# Patient Record
Sex: Female | Born: 1935
Health system: Southern US, Community
[De-identification: ages and names within clinical notes are randomized; demographics above are authoritative.]

## PROBLEM LIST (undated history)

## (undated) DIAGNOSIS — E7800 Pure hypercholesterolemia, unspecified: Secondary | ICD-10-CM

## (undated) DIAGNOSIS — I10 Essential (primary) hypertension: Secondary | ICD-10-CM

## (undated) DIAGNOSIS — M810 Age-related osteoporosis without current pathological fracture: Secondary | ICD-10-CM

## (undated) DIAGNOSIS — E079 Disorder of thyroid, unspecified: Secondary | ICD-10-CM

## (undated) HISTORY — PX: ABDOMINAL HYSTERECTOMY: SHX81

## (undated) HISTORY — DX: Age-related osteoporosis without current pathological fracture: M81.0

## (undated) HISTORY — PX: TONSILLECTOMY: SUR1361

---

## 2001-02-17 ENCOUNTER — Inpatient Hospital Stay (HOSPITAL_COMMUNITY): Admission: AD | Admit: 2001-02-17 | Discharge: 2001-02-18 | Payer: Self-pay | Admitting: Internal Medicine

## 2001-02-18 ENCOUNTER — Encounter: Payer: Self-pay | Admitting: Cardiology

## 2002-08-14 HISTORY — PX: ANKLE SURGERY: SHX546

## 2009-11-16 ENCOUNTER — Ambulatory Visit: Payer: Self-pay | Admitting: Cardiology

## 2011-08-28 DIAGNOSIS — H35379 Puckering of macula, unspecified eye: Secondary | ICD-10-CM | POA: Diagnosis not present

## 2011-08-28 DIAGNOSIS — H25019 Cortical age-related cataract, unspecified eye: Secondary | ICD-10-CM | POA: Diagnosis not present

## 2011-08-28 DIAGNOSIS — H40039 Anatomical narrow angle, unspecified eye: Secondary | ICD-10-CM | POA: Diagnosis not present

## 2011-09-10 DIAGNOSIS — Z79899 Other long term (current) drug therapy: Secondary | ICD-10-CM | POA: Diagnosis not present

## 2011-09-10 DIAGNOSIS — R0789 Other chest pain: Secondary | ICD-10-CM | POA: Diagnosis not present

## 2011-09-10 DIAGNOSIS — K449 Diaphragmatic hernia without obstruction or gangrene: Secondary | ICD-10-CM | POA: Diagnosis not present

## 2011-09-10 DIAGNOSIS — E039 Hypothyroidism, unspecified: Secondary | ICD-10-CM | POA: Diagnosis not present

## 2011-09-10 DIAGNOSIS — R079 Chest pain, unspecified: Secondary | ICD-10-CM | POA: Diagnosis not present

## 2011-09-10 DIAGNOSIS — Z780 Asymptomatic menopausal state: Secondary | ICD-10-CM | POA: Diagnosis not present

## 2011-09-10 DIAGNOSIS — Z8249 Family history of ischemic heart disease and other diseases of the circulatory system: Secondary | ICD-10-CM | POA: Diagnosis not present

## 2011-09-10 DIAGNOSIS — E785 Hyperlipidemia, unspecified: Secondary | ICD-10-CM | POA: Diagnosis not present

## 2011-09-11 DIAGNOSIS — E785 Hyperlipidemia, unspecified: Secondary | ICD-10-CM | POA: Diagnosis not present

## 2011-09-11 DIAGNOSIS — E039 Hypothyroidism, unspecified: Secondary | ICD-10-CM | POA: Diagnosis not present

## 2011-09-11 DIAGNOSIS — R079 Chest pain, unspecified: Secondary | ICD-10-CM | POA: Diagnosis not present

## 2011-09-11 DIAGNOSIS — K449 Diaphragmatic hernia without obstruction or gangrene: Secondary | ICD-10-CM | POA: Diagnosis not present

## 2011-09-13 DIAGNOSIS — R079 Chest pain, unspecified: Secondary | ICD-10-CM | POA: Diagnosis not present

## 2011-09-26 DIAGNOSIS — E782 Mixed hyperlipidemia: Secondary | ICD-10-CM | POA: Diagnosis not present

## 2011-09-26 DIAGNOSIS — E038 Other specified hypothyroidism: Secondary | ICD-10-CM | POA: Diagnosis not present

## 2011-09-26 DIAGNOSIS — E039 Hypothyroidism, unspecified: Secondary | ICD-10-CM | POA: Diagnosis not present

## 2011-09-26 DIAGNOSIS — I209 Angina pectoris, unspecified: Secondary | ICD-10-CM | POA: Diagnosis not present

## 2011-09-26 DIAGNOSIS — K2900 Acute gastritis without bleeding: Secondary | ICD-10-CM | POA: Diagnosis not present

## 2011-10-02 DIAGNOSIS — K8020 Calculus of gallbladder without cholecystitis without obstruction: Secondary | ICD-10-CM | POA: Diagnosis not present

## 2011-10-02 DIAGNOSIS — R109 Unspecified abdominal pain: Secondary | ICD-10-CM | POA: Diagnosis not present

## 2011-10-11 DIAGNOSIS — H2589 Other age-related cataract: Secondary | ICD-10-CM | POA: Diagnosis not present

## 2011-10-19 DIAGNOSIS — H2589 Other age-related cataract: Secondary | ICD-10-CM | POA: Diagnosis not present

## 2011-10-19 DIAGNOSIS — H40009 Preglaucoma, unspecified, unspecified eye: Secondary | ICD-10-CM | POA: Diagnosis not present

## 2011-10-19 DIAGNOSIS — H269 Unspecified cataract: Secondary | ICD-10-CM | POA: Diagnosis not present

## 2011-12-26 DIAGNOSIS — E039 Hypothyroidism, unspecified: Secondary | ICD-10-CM | POA: Diagnosis not present

## 2012-03-26 DIAGNOSIS — E038 Other specified hypothyroidism: Secondary | ICD-10-CM | POA: Diagnosis not present

## 2012-03-26 DIAGNOSIS — E782 Mixed hyperlipidemia: Secondary | ICD-10-CM | POA: Diagnosis not present

## 2012-03-26 DIAGNOSIS — E039 Hypothyroidism, unspecified: Secondary | ICD-10-CM | POA: Diagnosis not present

## 2012-03-27 DIAGNOSIS — Z961 Presence of intraocular lens: Secondary | ICD-10-CM | POA: Diagnosis not present

## 2012-03-27 DIAGNOSIS — H35379 Puckering of macula, unspecified eye: Secondary | ICD-10-CM | POA: Diagnosis not present

## 2012-03-27 DIAGNOSIS — H2589 Other age-related cataract: Secondary | ICD-10-CM | POA: Diagnosis not present

## 2012-04-22 DIAGNOSIS — Z23 Encounter for immunization: Secondary | ICD-10-CM | POA: Diagnosis not present

## 2012-07-01 DIAGNOSIS — H02409 Unspecified ptosis of unspecified eyelid: Secondary | ICD-10-CM | POA: Diagnosis not present

## 2012-07-02 DIAGNOSIS — E039 Hypothyroidism, unspecified: Secondary | ICD-10-CM | POA: Diagnosis not present

## 2012-07-15 DIAGNOSIS — M7130 Other bursal cyst, unspecified site: Secondary | ICD-10-CM | POA: Diagnosis not present

## 2012-10-01 DIAGNOSIS — M7120 Synovial cyst of popliteal space [Baker], unspecified knee: Secondary | ICD-10-CM | POA: Diagnosis not present

## 2012-10-01 DIAGNOSIS — E782 Mixed hyperlipidemia: Secondary | ICD-10-CM | POA: Diagnosis not present

## 2012-10-17 DIAGNOSIS — H02409 Unspecified ptosis of unspecified eyelid: Secondary | ICD-10-CM | POA: Diagnosis not present

## 2012-10-21 DIAGNOSIS — S82409A Unspecified fracture of shaft of unspecified fibula, initial encounter for closed fracture: Secondary | ICD-10-CM | POA: Diagnosis not present

## 2012-10-21 DIAGNOSIS — M25569 Pain in unspecified knee: Secondary | ICD-10-CM | POA: Diagnosis not present

## 2012-12-06 DIAGNOSIS — H1130 Conjunctival hemorrhage, unspecified eye: Secondary | ICD-10-CM | POA: Diagnosis not present

## 2012-12-24 DIAGNOSIS — A938 Other specified arthropod-borne viral fevers: Secondary | ICD-10-CM | POA: Diagnosis not present

## 2012-12-24 DIAGNOSIS — E038 Other specified hypothyroidism: Secondary | ICD-10-CM | POA: Diagnosis not present

## 2013-01-07 DIAGNOSIS — M7120 Synovial cyst of popliteal space [Baker], unspecified knee: Secondary | ICD-10-CM | POA: Diagnosis not present

## 2013-01-10 DIAGNOSIS — M7120 Synovial cyst of popliteal space [Baker], unspecified knee: Secondary | ICD-10-CM | POA: Diagnosis not present

## 2013-03-17 DIAGNOSIS — H029 Unspecified disorder of eyelid: Secondary | ICD-10-CM | POA: Diagnosis not present

## 2013-03-17 DIAGNOSIS — H02429 Myogenic ptosis of unspecified eyelid: Secondary | ICD-10-CM | POA: Diagnosis not present

## 2013-03-31 DIAGNOSIS — E038 Other specified hypothyroidism: Secondary | ICD-10-CM | POA: Diagnosis not present

## 2013-03-31 DIAGNOSIS — E782 Mixed hyperlipidemia: Secondary | ICD-10-CM | POA: Diagnosis not present

## 2013-05-14 DIAGNOSIS — Z961 Presence of intraocular lens: Secondary | ICD-10-CM | POA: Diagnosis not present

## 2013-05-14 DIAGNOSIS — H2589 Other age-related cataract: Secondary | ICD-10-CM | POA: Diagnosis not present

## 2013-05-14 DIAGNOSIS — H532 Diplopia: Secondary | ICD-10-CM | POA: Diagnosis not present

## 2013-06-12 DIAGNOSIS — Z23 Encounter for immunization: Secondary | ICD-10-CM | POA: Diagnosis not present

## 2013-07-01 DIAGNOSIS — R079 Chest pain, unspecified: Secondary | ICD-10-CM | POA: Diagnosis not present

## 2013-07-01 DIAGNOSIS — R059 Cough, unspecified: Secondary | ICD-10-CM | POA: Diagnosis not present

## 2013-07-01 DIAGNOSIS — R05 Cough: Secondary | ICD-10-CM | POA: Diagnosis not present

## 2013-09-23 DIAGNOSIS — Z0000 Encounter for general adult medical examination without abnormal findings: Secondary | ICD-10-CM | POA: Diagnosis not present

## 2013-09-23 DIAGNOSIS — E039 Hypothyroidism, unspecified: Secondary | ICD-10-CM | POA: Diagnosis not present

## 2013-09-23 DIAGNOSIS — E782 Mixed hyperlipidemia: Secondary | ICD-10-CM | POA: Diagnosis not present

## 2013-11-13 DIAGNOSIS — Z86010 Personal history of colonic polyps: Secondary | ICD-10-CM | POA: Diagnosis not present

## 2013-11-18 DIAGNOSIS — Z1211 Encounter for screening for malignant neoplasm of colon: Secondary | ICD-10-CM | POA: Diagnosis not present

## 2013-11-18 DIAGNOSIS — D371 Neoplasm of uncertain behavior of stomach: Secondary | ICD-10-CM | POA: Diagnosis not present

## 2013-11-18 DIAGNOSIS — D1779 Benign lipomatous neoplasm of other sites: Secondary | ICD-10-CM | POA: Diagnosis not present

## 2013-11-18 DIAGNOSIS — E039 Hypothyroidism, unspecified: Secondary | ICD-10-CM | POA: Diagnosis not present

## 2013-11-18 DIAGNOSIS — E785 Hyperlipidemia, unspecified: Secondary | ICD-10-CM | POA: Diagnosis not present

## 2013-11-18 DIAGNOSIS — Z860100 Personal history of colon polyps, unspecified: Secondary | ICD-10-CM | POA: Diagnosis not present

## 2013-11-18 DIAGNOSIS — Z7982 Long term (current) use of aspirin: Secondary | ICD-10-CM | POA: Diagnosis not present

## 2013-11-18 DIAGNOSIS — K644 Residual hemorrhoidal skin tags: Secondary | ICD-10-CM | POA: Diagnosis not present

## 2013-11-18 DIAGNOSIS — Z79899 Other long term (current) drug therapy: Secondary | ICD-10-CM | POA: Diagnosis not present

## 2013-11-18 DIAGNOSIS — D175 Benign lipomatous neoplasm of intra-abdominal organs: Secondary | ICD-10-CM | POA: Diagnosis not present

## 2013-11-18 DIAGNOSIS — D378 Neoplasm of uncertain behavior of other specified digestive organs: Secondary | ICD-10-CM | POA: Diagnosis not present

## 2013-11-18 DIAGNOSIS — Z823 Family history of stroke: Secondary | ICD-10-CM | POA: Diagnosis not present

## 2013-11-18 DIAGNOSIS — Z8249 Family history of ischemic heart disease and other diseases of the circulatory system: Secondary | ICD-10-CM | POA: Diagnosis not present

## 2013-12-23 DIAGNOSIS — E039 Hypothyroidism, unspecified: Secondary | ICD-10-CM | POA: Diagnosis not present

## 2013-12-29 DIAGNOSIS — Z79899 Other long term (current) drug therapy: Secondary | ICD-10-CM | POA: Diagnosis not present

## 2013-12-29 DIAGNOSIS — M899 Disorder of bone, unspecified: Secondary | ICD-10-CM | POA: Diagnosis not present

## 2013-12-29 DIAGNOSIS — Z780 Asymptomatic menopausal state: Secondary | ICD-10-CM | POA: Diagnosis not present

## 2013-12-29 DIAGNOSIS — M810 Age-related osteoporosis without current pathological fracture: Secondary | ICD-10-CM | POA: Diagnosis not present

## 2013-12-29 DIAGNOSIS — M949 Disorder of cartilage, unspecified: Secondary | ICD-10-CM | POA: Diagnosis not present

## 2014-01-12 DIAGNOSIS — R928 Other abnormal and inconclusive findings on diagnostic imaging of breast: Secondary | ICD-10-CM | POA: Diagnosis not present

## 2014-01-12 DIAGNOSIS — A938 Other specified arthropod-borne viral fevers: Secondary | ICD-10-CM | POA: Diagnosis not present

## 2014-01-12 DIAGNOSIS — J209 Acute bronchitis, unspecified: Secondary | ICD-10-CM | POA: Diagnosis not present

## 2014-01-12 DIAGNOSIS — Z1231 Encounter for screening mammogram for malignant neoplasm of breast: Secondary | ICD-10-CM | POA: Diagnosis not present

## 2014-01-14 DIAGNOSIS — M810 Age-related osteoporosis without current pathological fracture: Secondary | ICD-10-CM | POA: Diagnosis not present

## 2014-01-20 DIAGNOSIS — N63 Unspecified lump in unspecified breast: Secondary | ICD-10-CM | POA: Diagnosis not present

## 2014-01-20 DIAGNOSIS — N6009 Solitary cyst of unspecified breast: Secondary | ICD-10-CM | POA: Diagnosis not present

## 2014-01-20 DIAGNOSIS — R928 Other abnormal and inconclusive findings on diagnostic imaging of breast: Secondary | ICD-10-CM | POA: Diagnosis not present

## 2014-03-31 DIAGNOSIS — E039 Hypothyroidism, unspecified: Secondary | ICD-10-CM | POA: Diagnosis not present

## 2014-03-31 DIAGNOSIS — E038 Other specified hypothyroidism: Secondary | ICD-10-CM | POA: Diagnosis not present

## 2014-03-31 DIAGNOSIS — E782 Mixed hyperlipidemia: Secondary | ICD-10-CM | POA: Diagnosis not present

## 2014-05-27 DIAGNOSIS — Z23 Encounter for immunization: Secondary | ICD-10-CM | POA: Diagnosis not present

## 2014-06-15 DIAGNOSIS — H40031 Anatomical narrow angle, right eye: Secondary | ICD-10-CM | POA: Diagnosis not present

## 2014-06-15 DIAGNOSIS — H2511 Age-related nuclear cataract, right eye: Secondary | ICD-10-CM | POA: Diagnosis not present

## 2014-06-23 DIAGNOSIS — H2511 Age-related nuclear cataract, right eye: Secondary | ICD-10-CM | POA: Diagnosis not present

## 2014-06-30 DIAGNOSIS — Z9842 Cataract extraction status, left eye: Secondary | ICD-10-CM | POA: Diagnosis not present

## 2014-06-30 DIAGNOSIS — Z961 Presence of intraocular lens: Secondary | ICD-10-CM | POA: Diagnosis not present

## 2014-06-30 DIAGNOSIS — H2511 Age-related nuclear cataract, right eye: Secondary | ICD-10-CM | POA: Diagnosis not present

## 2014-06-30 DIAGNOSIS — E039 Hypothyroidism, unspecified: Secondary | ICD-10-CM | POA: Diagnosis not present

## 2014-06-30 DIAGNOSIS — H40003 Preglaucoma, unspecified, bilateral: Secondary | ICD-10-CM | POA: Diagnosis not present

## 2014-07-21 DIAGNOSIS — E039 Hypothyroidism, unspecified: Secondary | ICD-10-CM | POA: Diagnosis not present

## 2014-07-27 DIAGNOSIS — M810 Age-related osteoporosis without current pathological fracture: Secondary | ICD-10-CM | POA: Diagnosis not present

## 2014-08-04 DIAGNOSIS — D485 Neoplasm of uncertain behavior of skin: Secondary | ICD-10-CM | POA: Diagnosis not present

## 2014-08-04 DIAGNOSIS — H02822 Cysts of right lower eyelid: Secondary | ICD-10-CM | POA: Diagnosis not present

## 2014-11-17 DIAGNOSIS — E039 Hypothyroidism, unspecified: Secondary | ICD-10-CM | POA: Diagnosis not present

## 2014-11-17 DIAGNOSIS — L570 Actinic keratosis: Secondary | ICD-10-CM | POA: Diagnosis not present

## 2014-12-15 DIAGNOSIS — H26491 Other secondary cataract, right eye: Secondary | ICD-10-CM | POA: Diagnosis not present

## 2015-01-25 DIAGNOSIS — Z1231 Encounter for screening mammogram for malignant neoplasm of breast: Secondary | ICD-10-CM | POA: Diagnosis not present

## 2015-01-28 DIAGNOSIS — M810 Age-related osteoporosis without current pathological fracture: Secondary | ICD-10-CM | POA: Diagnosis not present

## 2015-03-16 DIAGNOSIS — L570 Actinic keratosis: Secondary | ICD-10-CM | POA: Diagnosis not present

## 2015-03-16 DIAGNOSIS — Z23 Encounter for immunization: Secondary | ICD-10-CM | POA: Diagnosis not present

## 2015-03-16 DIAGNOSIS — Z1389 Encounter for screening for other disorder: Secondary | ICD-10-CM | POA: Diagnosis not present

## 2015-03-16 DIAGNOSIS — E039 Hypothyroidism, unspecified: Secondary | ICD-10-CM | POA: Diagnosis not present

## 2015-03-16 DIAGNOSIS — Z0000 Encounter for general adult medical examination without abnormal findings: Secondary | ICD-10-CM | POA: Diagnosis not present

## 2015-04-22 DIAGNOSIS — M170 Bilateral primary osteoarthritis of knee: Secondary | ICD-10-CM | POA: Diagnosis not present

## 2015-04-22 DIAGNOSIS — M1711 Unilateral primary osteoarthritis, right knee: Secondary | ICD-10-CM | POA: Diagnosis not present

## 2015-04-22 DIAGNOSIS — M1712 Unilateral primary osteoarthritis, left knee: Secondary | ICD-10-CM | POA: Diagnosis not present

## 2015-05-11 DIAGNOSIS — M170 Bilateral primary osteoarthritis of knee: Secondary | ICD-10-CM | POA: Diagnosis not present

## 2015-05-11 DIAGNOSIS — M6281 Muscle weakness (generalized): Secondary | ICD-10-CM | POA: Diagnosis not present

## 2015-05-11 DIAGNOSIS — R2689 Other abnormalities of gait and mobility: Secondary | ICD-10-CM | POA: Diagnosis not present

## 2015-05-12 DIAGNOSIS — M6281 Muscle weakness (generalized): Secondary | ICD-10-CM | POA: Diagnosis not present

## 2015-05-12 DIAGNOSIS — M170 Bilateral primary osteoarthritis of knee: Secondary | ICD-10-CM | POA: Diagnosis not present

## 2015-05-12 DIAGNOSIS — R2689 Other abnormalities of gait and mobility: Secondary | ICD-10-CM | POA: Diagnosis not present

## 2015-05-17 DIAGNOSIS — R2689 Other abnormalities of gait and mobility: Secondary | ICD-10-CM | POA: Diagnosis not present

## 2015-05-17 DIAGNOSIS — M170 Bilateral primary osteoarthritis of knee: Secondary | ICD-10-CM | POA: Diagnosis not present

## 2015-05-17 DIAGNOSIS — M6281 Muscle weakness (generalized): Secondary | ICD-10-CM | POA: Diagnosis not present

## 2015-05-20 DIAGNOSIS — M170 Bilateral primary osteoarthritis of knee: Secondary | ICD-10-CM | POA: Diagnosis not present

## 2015-05-20 DIAGNOSIS — M6281 Muscle weakness (generalized): Secondary | ICD-10-CM | POA: Diagnosis not present

## 2015-05-20 DIAGNOSIS — R2689 Other abnormalities of gait and mobility: Secondary | ICD-10-CM | POA: Diagnosis not present

## 2015-05-25 DIAGNOSIS — R2689 Other abnormalities of gait and mobility: Secondary | ICD-10-CM | POA: Diagnosis not present

## 2015-05-25 DIAGNOSIS — M6281 Muscle weakness (generalized): Secondary | ICD-10-CM | POA: Diagnosis not present

## 2015-05-25 DIAGNOSIS — M170 Bilateral primary osteoarthritis of knee: Secondary | ICD-10-CM | POA: Diagnosis not present

## 2015-05-26 DIAGNOSIS — R2689 Other abnormalities of gait and mobility: Secondary | ICD-10-CM | POA: Diagnosis not present

## 2015-05-26 DIAGNOSIS — M170 Bilateral primary osteoarthritis of knee: Secondary | ICD-10-CM | POA: Diagnosis not present

## 2015-05-26 DIAGNOSIS — M6281 Muscle weakness (generalized): Secondary | ICD-10-CM | POA: Diagnosis not present

## 2015-06-01 DIAGNOSIS — M170 Bilateral primary osteoarthritis of knee: Secondary | ICD-10-CM | POA: Diagnosis not present

## 2015-06-01 DIAGNOSIS — M6281 Muscle weakness (generalized): Secondary | ICD-10-CM | POA: Diagnosis not present

## 2015-06-01 DIAGNOSIS — R2689 Other abnormalities of gait and mobility: Secondary | ICD-10-CM | POA: Diagnosis not present

## 2015-06-02 DIAGNOSIS — R2689 Other abnormalities of gait and mobility: Secondary | ICD-10-CM | POA: Diagnosis not present

## 2015-06-02 DIAGNOSIS — M170 Bilateral primary osteoarthritis of knee: Secondary | ICD-10-CM | POA: Diagnosis not present

## 2015-06-02 DIAGNOSIS — M6281 Muscle weakness (generalized): Secondary | ICD-10-CM | POA: Diagnosis not present

## 2015-06-24 DIAGNOSIS — Z23 Encounter for immunization: Secondary | ICD-10-CM | POA: Diagnosis not present

## 2015-07-20 DIAGNOSIS — B028 Zoster with other complications: Secondary | ICD-10-CM | POA: Diagnosis not present

## 2015-07-20 DIAGNOSIS — E038 Other specified hypothyroidism: Secondary | ICD-10-CM | POA: Diagnosis not present

## 2015-07-27 DIAGNOSIS — M810 Age-related osteoporosis without current pathological fracture: Secondary | ICD-10-CM | POA: Diagnosis not present

## 2015-11-16 DIAGNOSIS — M818 Other osteoporosis without current pathological fracture: Secondary | ICD-10-CM | POA: Diagnosis not present

## 2015-11-16 DIAGNOSIS — E038 Other specified hypothyroidism: Secondary | ICD-10-CM | POA: Diagnosis not present

## 2015-11-16 DIAGNOSIS — E784 Other hyperlipidemia: Secondary | ICD-10-CM | POA: Diagnosis not present

## 2016-01-27 DIAGNOSIS — Z1231 Encounter for screening mammogram for malignant neoplasm of breast: Secondary | ICD-10-CM | POA: Diagnosis not present

## 2016-03-14 DIAGNOSIS — Z1389 Encounter for screening for other disorder: Secondary | ICD-10-CM | POA: Diagnosis not present

## 2016-03-14 DIAGNOSIS — Z79899 Other long term (current) drug therapy: Secondary | ICD-10-CM | POA: Diagnosis not present

## 2016-03-14 DIAGNOSIS — Z0000 Encounter for general adult medical examination without abnormal findings: Secondary | ICD-10-CM | POA: Diagnosis not present

## 2016-03-14 DIAGNOSIS — E038 Other specified hypothyroidism: Secondary | ICD-10-CM | POA: Diagnosis not present

## 2016-03-14 DIAGNOSIS — E784 Other hyperlipidemia: Secondary | ICD-10-CM | POA: Diagnosis not present

## 2016-05-31 DIAGNOSIS — Z23 Encounter for immunization: Secondary | ICD-10-CM | POA: Diagnosis not present

## 2016-06-07 DIAGNOSIS — E038 Other specified hypothyroidism: Secondary | ICD-10-CM | POA: Diagnosis not present

## 2016-06-07 DIAGNOSIS — E784 Other hyperlipidemia: Secondary | ICD-10-CM | POA: Diagnosis not present

## 2016-07-18 DIAGNOSIS — E038 Other specified hypothyroidism: Secondary | ICD-10-CM | POA: Diagnosis not present

## 2016-07-18 DIAGNOSIS — E784 Other hyperlipidemia: Secondary | ICD-10-CM | POA: Diagnosis not present

## 2016-07-18 DIAGNOSIS — L570 Actinic keratosis: Secondary | ICD-10-CM | POA: Diagnosis not present

## 2016-08-03 DIAGNOSIS — E784 Other hyperlipidemia: Secondary | ICD-10-CM | POA: Diagnosis not present

## 2016-08-03 DIAGNOSIS — E038 Other specified hypothyroidism: Secondary | ICD-10-CM | POA: Diagnosis not present

## 2016-08-31 DIAGNOSIS — E784 Other hyperlipidemia: Secondary | ICD-10-CM | POA: Diagnosis not present

## 2016-08-31 DIAGNOSIS — E038 Other specified hypothyroidism: Secondary | ICD-10-CM | POA: Diagnosis not present

## 2016-09-27 DIAGNOSIS — E038 Other specified hypothyroidism: Secondary | ICD-10-CM | POA: Diagnosis not present

## 2016-09-27 DIAGNOSIS — E784 Other hyperlipidemia: Secondary | ICD-10-CM | POA: Diagnosis not present

## 2016-11-14 DIAGNOSIS — E784 Other hyperlipidemia: Secondary | ICD-10-CM | POA: Diagnosis not present

## 2016-11-14 DIAGNOSIS — E038 Other specified hypothyroidism: Secondary | ICD-10-CM | POA: Diagnosis not present

## 2016-11-14 DIAGNOSIS — M80021D Age-related osteoporosis with current pathological fracture, right humerus, subsequent encounter for fracture with routine healing: Secondary | ICD-10-CM | POA: Diagnosis not present

## 2017-01-29 DIAGNOSIS — Z1231 Encounter for screening mammogram for malignant neoplasm of breast: Secondary | ICD-10-CM | POA: Diagnosis not present

## 2017-03-20 DIAGNOSIS — L570 Actinic keratosis: Secondary | ICD-10-CM | POA: Diagnosis not present

## 2017-03-20 DIAGNOSIS — E784 Other hyperlipidemia: Secondary | ICD-10-CM | POA: Diagnosis not present

## 2017-03-20 DIAGNOSIS — M80021D Age-related osteoporosis with current pathological fracture, right humerus, subsequent encounter for fracture with routine healing: Secondary | ICD-10-CM | POA: Diagnosis not present

## 2017-03-20 DIAGNOSIS — E038 Other specified hypothyroidism: Secondary | ICD-10-CM | POA: Diagnosis not present

## 2017-04-09 DIAGNOSIS — M810 Age-related osteoporosis without current pathological fracture: Secondary | ICD-10-CM | POA: Diagnosis not present

## 2017-06-20 DIAGNOSIS — Z23 Encounter for immunization: Secondary | ICD-10-CM | POA: Diagnosis not present

## 2017-06-21 DIAGNOSIS — L299 Pruritus, unspecified: Secondary | ICD-10-CM | POA: Diagnosis not present

## 2017-07-17 DIAGNOSIS — E038 Other specified hypothyroidism: Secondary | ICD-10-CM | POA: Diagnosis not present

## 2017-07-17 DIAGNOSIS — M80021D Age-related osteoporosis with current pathological fracture, right humerus, subsequent encounter for fracture with routine healing: Secondary | ICD-10-CM | POA: Diagnosis not present

## 2017-07-17 DIAGNOSIS — E7849 Other hyperlipidemia: Secondary | ICD-10-CM | POA: Diagnosis not present

## 2017-07-30 DIAGNOSIS — M810 Age-related osteoporosis without current pathological fracture: Secondary | ICD-10-CM | POA: Diagnosis not present

## 2017-07-30 DIAGNOSIS — Z780 Asymptomatic menopausal state: Secondary | ICD-10-CM | POA: Diagnosis not present

## 2017-09-23 ENCOUNTER — Other Ambulatory Visit: Payer: Self-pay

## 2017-09-23 ENCOUNTER — Encounter (HOSPITAL_COMMUNITY): Payer: Self-pay | Admitting: Emergency Medicine

## 2017-09-23 ENCOUNTER — Emergency Department (HOSPITAL_COMMUNITY): Payer: Medicare Other

## 2017-09-23 ENCOUNTER — Emergency Department (HOSPITAL_COMMUNITY)
Admission: EM | Admit: 2017-09-23 | Discharge: 2017-09-23 | Disposition: A | Discharge status: Home / Self Care | Payer: Medicare Other | Attending: Emergency Medicine | Admitting: Emergency Medicine

## 2017-09-23 DIAGNOSIS — J40 Bronchitis, not specified as acute or chronic: Secondary | ICD-10-CM | POA: Diagnosis not present

## 2017-09-23 DIAGNOSIS — R079 Chest pain, unspecified: Secondary | ICD-10-CM | POA: Diagnosis present

## 2017-09-23 DIAGNOSIS — R072 Precordial pain: Secondary | ICD-10-CM | POA: Diagnosis not present

## 2017-09-23 HISTORY — DX: Pure hypercholesterolemia, unspecified: E78.00

## 2017-09-23 HISTORY — DX: Disorder of thyroid, unspecified: E07.9

## 2017-09-23 LAB — CBC
HCT: 41.9 % (ref 36.0–46.0)
HEMOGLOBIN: 13.5 g/dL (ref 12.0–15.0)
MCH: 29.4 pg (ref 26.0–34.0)
MCHC: 32.2 g/dL (ref 30.0–36.0)
MCV: 91.3 fL (ref 78.0–100.0)
Platelets: 240 10*3/uL (ref 150–400)
RBC: 4.59 MIL/uL (ref 3.87–5.11)
RDW: 13.3 % (ref 11.5–15.5)
WBC: 4.7 10*3/uL (ref 4.0–10.5)

## 2017-09-23 LAB — BASIC METABOLIC PANEL
ANION GAP: 9 (ref 5–15)
BUN: 6 mg/dL (ref 6–20)
CALCIUM: 8.8 mg/dL — AB (ref 8.9–10.3)
CO2: 22 mmol/L (ref 22–32)
Chloride: 104 mmol/L (ref 101–111)
Creatinine, Ser: 0.52 mg/dL (ref 0.44–1.00)
Glucose, Bld: 95 mg/dL (ref 65–99)
POTASSIUM: 3.7 mmol/L (ref 3.5–5.1)
Sodium: 135 mmol/L (ref 135–145)

## 2017-09-23 LAB — TROPONIN I

## 2017-09-23 NOTE — ED Triage Notes (Signed)
Patient c/o intermittent, left-side chest pain x2 days. Denies any shortness of breath, weakness, nausea, or vomiting. Denis any cardiac hx.

## 2017-09-23 NOTE — Discharge Instructions (Signed)
Return for any new or worse chest pain.  Return for development of fevers.  Make an appointment to follow-up with your doctor if not improving over the next several days.  Today's workup without any significant cardiac findings.

## 2017-09-23 NOTE — ED Notes (Signed)
ED Provider at bedside.- Dr Zackowski 

## 2017-09-23 NOTE — ED Provider Notes (Signed)
[facility] EMERGENCY DEPARTMENT Provider Note   CSN: [id number] Arrival date & time: 09/23/17  1327     History   Chief Complaint Chief Complaint  Patient presents with  . Chest Pain    HPI Olivia Haas is a 84 y.o. female.  Patient with onset of left anterior chest pain this morning at 8 in the morning.  Not really described as pain but or as a discomfort or ache.  Dull in nature.  Nonradiating.  Will wax and wane some but has not resolved since its onset.  Patient's had an upper respiratory infection this week with a cough that is occasionally productive.  Patient without any known cardiac history.  No shortness of breath, no fevers.  Patient states that the upper respiratory infection is mild.      Past Medical History:  Diagnosis Date  . High cholesterol   . Thyroid disease     There are no active problems to display for this patient.   Past Surgical History:  Procedure Laterality Date  . ABDOMINAL HYSTERECTOMY    . TONSILLECTOMY      OB History    No data available       Home Medications    Prior to Admission medications   Not on File    Family History No family history on file.  Social History Social History   Tobacco Use  . Smoking status: Never Smoker  . Smokeless tobacco: Never Used  Substance Use Topics  . Alcohol use: No    Frequency: Never  . Drug use: No     Allergies   Patient has no known allergies.   Review of Systems Review of Systems  Constitutional: Negative for fever.  HENT: Negative for congestion.   Eyes: Negative for redness.  Respiratory: Positive for cough. Negative for shortness of breath.   Cardiovascular: Positive for chest pain.  Gastrointestinal: Negative for abdominal pain.  Genitourinary: Negative for dysuria.  Musculoskeletal: Negative for back pain and myalgias.  Skin: Negative for rash.  Neurological: Negative for syncope and headaches.  Psychiatric/Behavioral: Negative for confusion.      Physical Exam Updated Vital Signs BP (!) 164/73   Pulse 63   Temp (!) 97.2 F (36.2 C) (Oral)   Resp 18   Ht 1.524 m (5')   Wt 55.8 kg (123 lb)   SpO2 99%   BMI 24.02 kg/m   Physical Exam  Constitutional: She is oriented to person, place, and time. She appears well-developed and well-nourished. No distress.  HENT:  Head: Normocephalic and atraumatic.  Mouth/Throat: Oropharynx is clear and moist.  Eyes: Conjunctivae and EOM are normal. Pupils are equal, round, and reactive to light.  Neck: Normal range of motion. Neck supple.  Cardiovascular: Normal rate, regular rhythm and normal heart sounds.  Pulmonary/Chest: Effort normal and breath sounds normal. No respiratory distress. She has no wheezes.  Abdominal: Soft. Bowel sounds are normal.  Musculoskeletal: Normal range of motion. She exhibits no edema.  Neurological: She is alert and oriented to person, place, and time. No cranial nerve deficit or sensory deficit. She exhibits normal muscle tone. Coordination normal.  Skin: Skin is warm.  Nursing note and vitals reviewed.    ED Treatments / Results  Labs (all labs ordered are listed, but only abnormal results are displayed) Labs Reviewed  BASIC METABOLIC PANEL - Abnormal; Notable for the following components:      Result Value   Calcium 8.8 (*)    All other components within  normal limits  CBC  TROPONIN I  TROPONIN I    EKG  EKG Interpretation  Date/Time:  Sunday September 23 2017 13:35:28 EST Ventricular Rate:  63 PR Interval:  184 QRS Duration: 76 QT Interval:  432 QTC Calculation: 442 R Axis:   80 Text Interpretation:  Normal sinus rhythm Septal infarct , age undetermined Abnormal ECG Nonspecific ST abnormality No significant change since last tracing Confirmed by Fredia Sorrow [phone]) on 09/23/2017 7:50:38 PM       Radiology Dg Chest 2 View  Result Date: 09/23/2017 CLINICAL DATA:  Chest pain EXAM: CHEST  2 VIEW COMPARISON:  July 01, 2013  FINDINGS: There is mild scarring in the left base. Lungs elsewhere clear. Heart size and pulmonary vascularity are normal. No adenopathy. No pneumothorax. There is mild degenerative change in the thoracic spine. There is aortic atherosclerosis. IMPRESSION: Mild scarring left base. No edema or consolidation. There is aortic atherosclerosis. Aortic Atherosclerosis (ICD10-I70.0). Electronically Signed   By: Lowella Grip III M.D.   On: 09/23/2017 13:57    Procedures Procedures (including critical care time)  Medications Ordered in ED Medications - No data to display   Initial Impression / Assessment and Plan / ED Course  I have reviewed the triage vital signs and the nursing notes.  Pertinent labs & imaging results that were available during my care of the patient were reviewed by me and considered in my medical decision making (see chart for details).    Workup for the cardiac chest pain without any acute findings.  Troponins x2 are negative chest x-ray without evidence of pneumonia pneumothorax or pulmonary edema.  Basic labs without significant abnormality.  Patient's without evidence of hypoxia.  Patient stable for discharge home with precautions.  Will return for any new or worse chest pain will return for development of any fevers.  Patient nontoxic no acute distress.   Final Clinical Impressions(s) / ED Diagnoses   Final diagnoses:  Precordial pain  Bronchitis    ED Discharge Orders    None       Fredia Sorrow, MD 09/23/17 2026

## 2017-09-25 DIAGNOSIS — J40 Bronchitis, not specified as acute or chronic: Secondary | ICD-10-CM | POA: Diagnosis not present

## 2017-10-09 DIAGNOSIS — J40 Bronchitis, not specified as acute or chronic: Secondary | ICD-10-CM | POA: Diagnosis not present

## 2017-10-09 DIAGNOSIS — Z1389 Encounter for screening for other disorder: Secondary | ICD-10-CM | POA: Diagnosis not present

## 2017-10-09 DIAGNOSIS — Z0000 Encounter for general adult medical examination without abnormal findings: Secondary | ICD-10-CM | POA: Diagnosis not present

## 2017-10-17 DIAGNOSIS — M810 Age-related osteoporosis without current pathological fracture: Secondary | ICD-10-CM | POA: Diagnosis not present

## 2017-11-12 DIAGNOSIS — M810 Age-related osteoporosis without current pathological fracture: Secondary | ICD-10-CM | POA: Diagnosis not present

## 2017-11-12 DIAGNOSIS — E038 Other specified hypothyroidism: Secondary | ICD-10-CM | POA: Diagnosis not present

## 2017-11-12 DIAGNOSIS — E7849 Other hyperlipidemia: Secondary | ICD-10-CM | POA: Diagnosis not present

## 2017-11-12 DIAGNOSIS — Z1389 Encounter for screening for other disorder: Secondary | ICD-10-CM | POA: Diagnosis not present

## 2017-11-12 DIAGNOSIS — J40 Bronchitis, not specified as acute or chronic: Secondary | ICD-10-CM | POA: Diagnosis not present

## 2017-11-12 DIAGNOSIS — Z0000 Encounter for general adult medical examination without abnormal findings: Secondary | ICD-10-CM | POA: Diagnosis not present

## 2018-02-18 DIAGNOSIS — Z1231 Encounter for screening mammogram for malignant neoplasm of breast: Secondary | ICD-10-CM | POA: Diagnosis not present

## 2018-03-18 DIAGNOSIS — E038 Other specified hypothyroidism: Secondary | ICD-10-CM | POA: Diagnosis not present

## 2018-03-18 DIAGNOSIS — E7849 Other hyperlipidemia: Secondary | ICD-10-CM | POA: Diagnosis not present

## 2018-03-18 DIAGNOSIS — L570 Actinic keratosis: Secondary | ICD-10-CM | POA: Diagnosis not present

## 2018-03-18 DIAGNOSIS — M810 Age-related osteoporosis without current pathological fracture: Secondary | ICD-10-CM | POA: Diagnosis not present

## 2018-04-03 DIAGNOSIS — H26492 Other secondary cataract, left eye: Secondary | ICD-10-CM | POA: Diagnosis not present

## 2018-04-03 DIAGNOSIS — H40013 Open angle with borderline findings, low risk, bilateral: Secondary | ICD-10-CM | POA: Diagnosis not present

## 2018-06-19 DIAGNOSIS — Z23 Encounter for immunization: Secondary | ICD-10-CM | POA: Diagnosis not present

## 2018-07-17 DIAGNOSIS — E038 Other specified hypothyroidism: Secondary | ICD-10-CM | POA: Diagnosis not present

## 2018-07-17 DIAGNOSIS — M810 Age-related osteoporosis without current pathological fracture: Secondary | ICD-10-CM | POA: Diagnosis not present

## 2018-07-17 DIAGNOSIS — E7849 Other hyperlipidemia: Secondary | ICD-10-CM | POA: Diagnosis not present

## 2018-11-25 DIAGNOSIS — Z0000 Encounter for general adult medical examination without abnormal findings: Secondary | ICD-10-CM | POA: Diagnosis not present

## 2018-11-25 DIAGNOSIS — E7849 Other hyperlipidemia: Secondary | ICD-10-CM | POA: Diagnosis not present

## 2018-11-25 DIAGNOSIS — Z1389 Encounter for screening for other disorder: Secondary | ICD-10-CM | POA: Diagnosis not present

## 2018-11-25 DIAGNOSIS — M810 Age-related osteoporosis without current pathological fracture: Secondary | ICD-10-CM | POA: Diagnosis not present

## 2018-11-25 DIAGNOSIS — E038 Other specified hypothyroidism: Secondary | ICD-10-CM | POA: Diagnosis not present

## 2019-03-12 ENCOUNTER — Other Ambulatory Visit: Payer: Self-pay

## 2019-03-20 DIAGNOSIS — H8110 Benign paroxysmal vertigo, unspecified ear: Secondary | ICD-10-CM | POA: Diagnosis not present

## 2019-03-20 DIAGNOSIS — E039 Hypothyroidism, unspecified: Secondary | ICD-10-CM | POA: Diagnosis not present

## 2019-03-20 DIAGNOSIS — M810 Age-related osteoporosis without current pathological fracture: Secondary | ICD-10-CM | POA: Diagnosis not present

## 2019-03-20 DIAGNOSIS — E7849 Other hyperlipidemia: Secondary | ICD-10-CM | POA: Diagnosis not present

## 2019-05-08 DIAGNOSIS — Z23 Encounter for immunization: Secondary | ICD-10-CM | POA: Diagnosis not present

## 2019-07-16 DIAGNOSIS — E038 Other specified hypothyroidism: Secondary | ICD-10-CM | POA: Diagnosis not present

## 2019-07-16 DIAGNOSIS — M810 Age-related osteoporosis without current pathological fracture: Secondary | ICD-10-CM | POA: Diagnosis not present

## 2019-07-16 DIAGNOSIS — H8110 Benign paroxysmal vertigo, unspecified ear: Secondary | ICD-10-CM | POA: Diagnosis not present

## 2019-07-16 DIAGNOSIS — E7849 Other hyperlipidemia: Secondary | ICD-10-CM | POA: Diagnosis not present

## 2019-08-25 DIAGNOSIS — H40013 Open angle with borderline findings, low risk, bilateral: Secondary | ICD-10-CM | POA: Diagnosis not present

## 2019-08-25 DIAGNOSIS — H35372 Puckering of macula, left eye: Secondary | ICD-10-CM | POA: Diagnosis not present

## 2019-10-09 DIAGNOSIS — Z23 Encounter for immunization: Secondary | ICD-10-CM | POA: Diagnosis not present

## 2019-11-07 DIAGNOSIS — Z23 Encounter for immunization: Secondary | ICD-10-CM | POA: Diagnosis not present

## 2019-11-18 DIAGNOSIS — M79661 Pain in right lower leg: Secondary | ICD-10-CM | POA: Diagnosis not present

## 2019-11-18 DIAGNOSIS — M25671 Stiffness of right ankle, not elsewhere classified: Secondary | ICD-10-CM | POA: Diagnosis not present

## 2019-11-18 DIAGNOSIS — E7849 Other hyperlipidemia: Secondary | ICD-10-CM | POA: Diagnosis not present

## 2019-11-18 DIAGNOSIS — E038 Other specified hypothyroidism: Secondary | ICD-10-CM | POA: Diagnosis not present

## 2019-11-18 DIAGNOSIS — Z8781 Personal history of (healed) traumatic fracture: Secondary | ICD-10-CM | POA: Diagnosis not present

## 2019-11-18 DIAGNOSIS — M25571 Pain in right ankle and joints of right foot: Secondary | ICD-10-CM | POA: Diagnosis not present

## 2019-11-18 DIAGNOSIS — J40 Bronchitis, not specified as acute or chronic: Secondary | ICD-10-CM | POA: Diagnosis not present

## 2019-11-18 DIAGNOSIS — M1711 Unilateral primary osteoarthritis, right knee: Secondary | ICD-10-CM | POA: Diagnosis not present

## 2019-11-18 DIAGNOSIS — M25673 Stiffness of unspecified ankle, not elsewhere classified: Secondary | ICD-10-CM | POA: Diagnosis not present

## 2019-12-04 ENCOUNTER — Ambulatory Visit (INDEPENDENT_AMBULATORY_CARE_PROVIDER_SITE_OTHER): Payer: Medicare Other | Admitting: Orthopaedic Surgery

## 2019-12-04 ENCOUNTER — Other Ambulatory Visit: Payer: Self-pay

## 2019-12-04 ENCOUNTER — Encounter: Payer: Self-pay | Admitting: Orthopaedic Surgery

## 2019-12-04 DIAGNOSIS — Z9889 Other specified postprocedural states: Secondary | ICD-10-CM

## 2019-12-04 NOTE — Progress Notes (Signed)
   Post-Op Visit Note   Patient: Olivia Haas           Date of Birth: 26-Jun-1933           MRN: [id number] Visit Date: 12/04/2019 PCP: Neale Burly, MD   Assessment & Plan: Post lumbar decompression L3-4, L4-5. She not take any pain medication she is ambulatory with a rolling walker with 5 inch front wheels. She is not taking pain medication and can resume driving. She will continue to ambulate increase her distance. Avoid bending and lifting for another 4 weeks. Recheck 5 weeks. She is happy the surgical result and still has a little bit of numbness in her toes as expected.  Chief Complaint:  Chief Complaint  Patient presents with  . Right Ankle - Pain   Visit Diagnoses:  1. History of lumbar laminectomy for spinal cord decompression     Plan: Return 5 weeks staples harvested today.  Follow-Up Instructions: Return in about 5 weeks (around 01/08/2020), or if symptoms worsen or fail to improve.   Orders:  No orders of the defined types were placed in this encounter.  No orders of the defined types were placed in this encounter.   Imaging: No results found.  PMFS History: Patient Active Problem List   Diagnosis Date Noted  . History of lumbar laminectomy for spinal cord decompression 12/04/2019   Past Medical History:  Diagnosis Date  . High cholesterol   . Thyroid disease     No family history on file.  Past Surgical History:  Procedure Laterality Date  . ABDOMINAL HYSTERECTOMY    . TONSILLECTOMY     Social History   Occupational History  . Not on file  Tobacco Use  . Smoking status: Never Smoker  . Smokeless tobacco: Never Used  Substance and Sexual Activity  . Alcohol use: No  . Drug use: No  . Sexual activity: Not on file

## 2020-02-11 DIAGNOSIS — R748 Abnormal levels of other serum enzymes: Secondary | ICD-10-CM | POA: Diagnosis not present

## 2020-02-11 DIAGNOSIS — R109 Unspecified abdominal pain: Secondary | ICD-10-CM | POA: Diagnosis not present

## 2020-02-11 DIAGNOSIS — K8070 Calculus of gallbladder and bile duct without cholecystitis without obstruction: Secondary | ICD-10-CM | POA: Diagnosis not present

## 2020-02-11 DIAGNOSIS — R10811 Right upper quadrant abdominal tenderness: Secondary | ICD-10-CM | POA: Diagnosis not present

## 2020-02-11 DIAGNOSIS — I700 Atherosclerosis of aorta: Secondary | ICD-10-CM | POA: Diagnosis not present

## 2020-02-11 DIAGNOSIS — K429 Umbilical hernia without obstruction or gangrene: Secondary | ICD-10-CM | POA: Diagnosis not present

## 2020-02-11 DIAGNOSIS — R1011 Right upper quadrant pain: Secondary | ICD-10-CM | POA: Diagnosis not present

## 2020-02-11 DIAGNOSIS — Z90710 Acquired absence of both cervix and uterus: Secondary | ICD-10-CM | POA: Diagnosis not present

## 2020-02-12 ENCOUNTER — Other Ambulatory Visit: Payer: Self-pay

## 2020-02-12 ENCOUNTER — Observation Stay (HOSPITAL_COMMUNITY): Payer: Medicare Other

## 2020-02-12 ENCOUNTER — Emergency Department (HOSPITAL_COMMUNITY)
Admission: EM | Admit: 2020-02-12 | Discharge: 2020-02-12 | Disposition: A | Discharge status: Home / Self Care | Payer: Medicare Other | Source: Home / Self Care | Attending: Emergency Medicine | Admitting: Emergency Medicine

## 2020-02-12 ENCOUNTER — Encounter (HOSPITAL_COMMUNITY): Payer: Self-pay | Admitting: Emergency Medicine

## 2020-02-12 ENCOUNTER — Inpatient Hospital Stay (HOSPITAL_COMMUNITY)
Admission: EM | Admit: 2020-02-12 | Discharge: 2020-02-16 | DRG: 418 | Disposition: A | Discharge status: Home / Self Care | Payer: Medicare Other | Attending: General Surgery | Admitting: General Surgery

## 2020-02-12 ENCOUNTER — Emergency Department (HOSPITAL_COMMUNITY): Payer: Medicare Other

## 2020-02-12 ENCOUNTER — Encounter (HOSPITAL_COMMUNITY): Payer: Self-pay

## 2020-02-12 DIAGNOSIS — Z20822 Contact with and (suspected) exposure to covid-19: Secondary | ICD-10-CM | POA: Diagnosis present

## 2020-02-12 DIAGNOSIS — K8050 Calculus of bile duct without cholangitis or cholecystitis without obstruction: Secondary | ICD-10-CM | POA: Diagnosis present

## 2020-02-12 DIAGNOSIS — Z79899 Other long term (current) drug therapy: Secondary | ICD-10-CM

## 2020-02-12 DIAGNOSIS — R1084 Generalized abdominal pain: Secondary | ICD-10-CM | POA: Diagnosis not present

## 2020-02-12 DIAGNOSIS — Z90710 Acquired absence of both cervix and uterus: Secondary | ICD-10-CM

## 2020-02-12 DIAGNOSIS — R0902 Hypoxemia: Secondary | ICD-10-CM | POA: Diagnosis not present

## 2020-02-12 DIAGNOSIS — R1011 Right upper quadrant pain: Secondary | ICD-10-CM | POA: Diagnosis not present

## 2020-02-12 DIAGNOSIS — E079 Disorder of thyroid, unspecified: Secondary | ICD-10-CM | POA: Diagnosis present

## 2020-02-12 DIAGNOSIS — K8020 Calculus of gallbladder without cholecystitis without obstruction: Secondary | ICD-10-CM

## 2020-02-12 DIAGNOSIS — R030 Elevated blood-pressure reading, without diagnosis of hypertension: Secondary | ICD-10-CM | POA: Diagnosis present

## 2020-02-12 DIAGNOSIS — E876 Hypokalemia: Secondary | ICD-10-CM | POA: Diagnosis present

## 2020-02-12 DIAGNOSIS — K8010 Calculus of gallbladder with chronic cholecystitis without obstruction: Secondary | ICD-10-CM | POA: Diagnosis not present

## 2020-02-12 DIAGNOSIS — K8070 Calculus of gallbladder and bile duct without cholecystitis without obstruction: Principal | ICD-10-CM | POA: Diagnosis present

## 2020-02-12 DIAGNOSIS — B029 Zoster without complications: Secondary | ICD-10-CM | POA: Diagnosis not present

## 2020-02-12 DIAGNOSIS — E871 Hypo-osmolality and hyponatremia: Secondary | ICD-10-CM | POA: Diagnosis not present

## 2020-02-12 DIAGNOSIS — Z79890 Hormone replacement therapy (postmenopausal): Secondary | ICD-10-CM

## 2020-02-12 DIAGNOSIS — I700 Atherosclerosis of aorta: Secondary | ICD-10-CM | POA: Diagnosis not present

## 2020-02-12 DIAGNOSIS — Z01818 Encounter for other preprocedural examination: Secondary | ICD-10-CM | POA: Diagnosis not present

## 2020-02-12 DIAGNOSIS — R112 Nausea with vomiting, unspecified: Secondary | ICD-10-CM | POA: Diagnosis not present

## 2020-02-12 DIAGNOSIS — R079 Chest pain, unspecified: Secondary | ICD-10-CM | POA: Diagnosis not present

## 2020-02-12 DIAGNOSIS — R0789 Other chest pain: Secondary | ICD-10-CM | POA: Diagnosis not present

## 2020-02-12 DIAGNOSIS — K429 Umbilical hernia without obstruction or gangrene: Secondary | ICD-10-CM | POA: Diagnosis not present

## 2020-02-12 LAB — COMPREHENSIVE METABOLIC PANEL
ALT: 21 U/L (ref 0–44)
AST: 23 U/L (ref 15–41)
Albumin: 4.7 g/dL (ref 3.5–5.0)
Alkaline Phosphatase: 110 U/L (ref 38–126)
Anion gap: 14 (ref 5–15)
BUN: 9 mg/dL (ref 8–23)
CO2: 23 mmol/L (ref 22–32)
Calcium: 9.9 mg/dL (ref 8.9–10.3)
Chloride: 100 mmol/L (ref 98–111)
Creatinine, Ser: 0.65 mg/dL (ref 0.44–1.00)
GFR calc Af Amer: >60 mL/min (ref >60)
GFR calc non Af Amer: >60 mL/min (ref >60)
Glucose, Bld: 126 mg/dL — ABNORMAL HIGH (ref 70–99)
Potassium: 3.8 mmol/L (ref 3.5–5.1)
Sodium: 137 mmol/L (ref 135–145)
Total Bilirubin: 0.9 mg/dL (ref 0.3–1.2)
Total Protein: 7.5 g/dL (ref 6.5–8.1)

## 2020-02-12 LAB — CBC WITH DIFFERENTIAL/PLATELET
Abs Immature Granulocytes: 0.02 10*3/uL (ref 0.00–0.07)
Basophils Absolute: 0 10*3/uL (ref 0.0–0.1)
Basophils Relative: 0 %
Eosinophils Absolute: 0 10*3/uL (ref 0.0–0.5)
Eosinophils Relative: 0 %
HCT: 47.2 % — ABNORMAL HIGH (ref 36.0–46.0)
Hemoglobin: 15.8 g/dL — ABNORMAL HIGH (ref 12.0–15.0)
Immature Granulocytes: 0 %
Lymphocytes Relative: 16 %
Lymphs Abs: 1.2 10*3/uL (ref 0.7–4.0)
MCH: 30 pg (ref 26.0–34.0)
MCHC: 33.5 g/dL (ref 30.0–36.0)
MCV: 89.7 fL (ref 80.0–100.0)
Monocytes Absolute: 0.3 10*3/uL (ref 0.1–1.0)
Monocytes Relative: 4 %
Neutro Abs: 5.6 10*3/uL (ref 1.7–7.7)
Neutrophils Relative %: 80 %
Platelets: 317 10*3/uL (ref 150–400)
RBC: 5.26 MIL/uL — ABNORMAL HIGH (ref 3.87–5.11)
RDW: 12.6 % (ref 11.5–15.5)
WBC: 7.1 10*3/uL (ref 4.0–10.5)
nRBC: 0 % (ref 0.0–0.2)

## 2020-02-12 LAB — TROPONIN I (HIGH SENSITIVITY)
Troponin I (High Sensitivity): 6 ng/L (ref <18)
Troponin I (High Sensitivity): 6 ng/L (ref <18)

## 2020-02-12 LAB — SARS CORONAVIRUS 2 BY RT PCR (HOSPITAL ORDER, PERFORMED IN ~~LOC~~ HOSPITAL LAB): SARS Coronavirus 2: NEGATIVE

## 2020-02-12 LAB — LIPASE, BLOOD: Lipase: 28 U/L (ref 11–51)

## 2020-02-12 MED ORDER — LEVOTHYROXINE SODIUM 100 MCG PO TABS
100.0000 ug | ORAL_TABLET | Freq: Every day | ORAL | Status: DC
  Administered 2020-02-15 – 2020-02-16 (×2): 100 ug via ORAL
  Filled 2020-02-12 (×2): qty 1

## 2020-02-12 MED ORDER — ONDANSETRON 4 MG PO TBDP: 4.0000 mg | ORAL_TABLET | Freq: Three times a day (TID) | ORAL | 0 refills | Status: DC | PRN

## 2020-02-12 MED ORDER — CHLORHEXIDINE GLUCONATE CLOTH 2 % EX PADS
6.0000 | MEDICATED_PAD | Freq: Once | CUTANEOUS | Status: AC
  Administered 2020-02-13: 6 via TOPICAL

## 2020-02-12 MED ORDER — OXYCODONE HCL 5 MG PO TABS
5.0000 mg | ORAL_TABLET | ORAL | Status: DC | PRN
  Administered 2020-02-13 – 2020-02-15 (×3): 5 mg via ORAL
  Filled 2020-02-12 (×3): qty 1

## 2020-02-12 MED ORDER — PANTOPRAZOLE SODIUM 40 MG IV SOLR
40.0000 mg | Freq: Every day | INTRAVENOUS | Status: DC
  Administered 2020-02-13 – 2020-02-15 (×4): 40 mg via INTRAVENOUS
  Filled 2020-02-12 (×4): qty 40

## 2020-02-12 MED ORDER — ACETAMINOPHEN 325 MG PO TABS: 650.0000 mg | ORAL_TABLET | Freq: Four times a day (QID) | ORAL | Status: DC | PRN

## 2020-02-12 MED ORDER — DOCUSATE SODIUM 100 MG PO CAPS
100.0000 mg | ORAL_CAPSULE | Freq: Two times a day (BID) | ORAL | Status: DC
  Administered 2020-02-13 – 2020-02-14 (×4): 100 mg via ORAL
  Filled 2020-02-12 (×5): qty 1

## 2020-02-12 MED ORDER — LACTATED RINGERS IV SOLN: INTRAVENOUS | Status: DC

## 2020-02-12 MED ORDER — SIMETHICONE 80 MG PO CHEW
40.0000 mg | CHEWABLE_TABLET | Freq: Four times a day (QID) | ORAL | Status: DC | PRN
  Administered 2020-02-13 – 2020-02-14 (×2): 40 mg via ORAL
  Filled 2020-02-12 (×2): qty 1

## 2020-02-12 MED ORDER — ENOXAPARIN SODIUM 30 MG/0.3ML ~~LOC~~ SOLN
30.0000 mg | SUBCUTANEOUS | Status: DC
  Administered 2020-02-13 (×2): 30 mg via SUBCUTANEOUS
  Filled 2020-02-12 (×2): qty 0.3

## 2020-02-12 MED ORDER — ACETAMINOPHEN 650 MG RE SUPP: 650.0000 mg | Freq: Four times a day (QID) | RECTAL | Status: DC | PRN

## 2020-02-12 MED ORDER — HYDROCODONE-ACETAMINOPHEN 5-325 MG PO TABS: 1.0000 | ORAL_TABLET | Freq: Four times a day (QID) | ORAL | 0 refills | Status: DC | PRN

## 2020-02-12 MED ORDER — SODIUM CHLORIDE 0.9 % IV BOLUS
500.0000 mL | Freq: Once | INTRAVENOUS | Status: AC
  Administered 2020-02-12: 500 mL via INTRAVENOUS

## 2020-02-12 MED ORDER — METOPROLOL TARTRATE 5 MG/5ML IV SOLN
5.0000 mg | Freq: Four times a day (QID) | INTRAVENOUS | Status: DC | PRN
  Filled 2020-02-12: qty 5

## 2020-02-12 MED ORDER — ONDANSETRON 4 MG PO TBDP
4.0000 mg | ORAL_TABLET | Freq: Four times a day (QID) | ORAL | Status: DC | PRN
  Administered 2020-02-13: 4 mg via ORAL
  Filled 2020-02-12: qty 1

## 2020-02-12 MED ORDER — MORPHINE SULFATE (PF) 2 MG/ML IV SOLN
1.0000 mg | INTRAVENOUS | Status: DC | PRN
  Administered 2020-02-12 – 2020-02-14 (×4): 1 mg via INTRAVENOUS
  Filled 2020-02-12 (×7): qty 1

## 2020-02-12 MED ORDER — ONDANSETRON HCL 4 MG/2ML IJ SOLN
4.0000 mg | Freq: Four times a day (QID) | INTRAMUSCULAR | Status: DC | PRN
  Administered 2020-02-13 – 2020-02-15 (×5): 4 mg via INTRAVENOUS
  Filled 2020-02-12 (×9): qty 2

## 2020-02-12 NOTE — H&P (Signed)
[facility] History and Physical  Reason for Referral: Symptomatic cholelithiasis with intractable nausea and vomiting  Referring Physician:  Neale Burly, MD   Chief Complaint    Emesis      Olivia Haas is a 86 y.o. female.  HPI:.  Olivia Haas is a healthy 86 yo who has a history of 2 days of abdominal pain and nausea and vomiting. She was seen by her PCP and told to go to the ED for further workup. She was in the  ED this AM and had an [place] that demonstrated gallstones but no cholecystitis and she had normal LFTs and labs.  She was discharged home with no pain but started vomiting again at home an called Dr. Sherrie Sport back. He has been in contact with me and it seems like the patient is having an episode of biliary colic and is unable to get relief. Given this I asked for them to come to the hospital for admission and laparoscopic cholecystectomy in the AM.  Currently the patient is having some pain in the RUQ area and it is radiating to her back and nausea/ bilious vomiting.  CT A/p done at [facility] 02/10/20 only demonstrated gallstones and no appendix. She thinks this was removed with her hysterectomy.    Past Medical History:  Diagnosis Date  . High cholesterol   . Thyroid disease     Past Surgical History:  Procedure Laterality Date  . ABDOMINAL HYSTERECTOMY    . ANKLE SURGERY Right 2004  . TONSILLECTOMY      Family History  Problem Relation Age of Onset  . Heart attack Father   . Anesthesia problems Daughter        Sensitive to medication/ waking up     Social History   Tobacco Use  . Smoking status: Never Smoker  . Smokeless tobacco: Never Used  Vaping Use  . Vaping Use: Never used  Substance Use Topics  . Alcohol use: No  . Drug use: No    Medications: I have reviewed the patient's current medications. Current Facility-Administered Medications  Medication Dose Route Frequency Provider Last Rate Last Admin  . acetaminophen  (TYLENOL) tablet 650 mg  650 mg Oral Q6H PRN Virl Cagey, MD       Or  . acetaminophen (TYLENOL) suppository 650 mg  650 mg Rectal Q6H PRN Virl Cagey, MD      . docusate sodium (COLACE) capsule 100 mg  100 mg Oral BID Virl Cagey, MD      . enoxaparin (LOVENOX) injection 30 mg  30 mg Subcutaneous Q24H Virl Cagey, MD      . lactated ringers infusion   Intravenous Continuous Virl Cagey, MD      . Derrill Memo ON 02/13/2020] levothyroxine (SYNTHROID) tablet 100 mcg  100 mcg Oral Q0600 Virl Cagey, MD      . metoprolol tartrate (LOPRESSOR) injection 5 mg  5 mg Intravenous Q6H PRN Virl Cagey, MD      . morphine 2 MG/ML injection 1 mg  1 mg Intravenous Q3H PRN Virl Cagey, MD      . ondansetron (ZOFRAN-ODT) disintegrating tablet 4 mg  4 mg Oral Q6H PRN Virl Cagey, MD       Or  . ondansetron [facility]) injection 4 mg  4 mg Intravenous Q6H PRN Virl Cagey, MD      . oxyCODONE (Oxy IR/ROXICODONE) immediate release tablet 5 mg  5 mg Oral Q4H  PRN Virl Cagey, MD      . pantoprazole (PROTONIX) injection 40 mg  40 mg Intravenous QHS Virl Cagey, MD      . simethicone [facility]) chewable tablet 40 mg  40 mg Oral Q6H PRN Virl Cagey, MD       Current Outpatient Medications  Medication Sig Dispense Refill Last Dose  . atorvastatin (LIPITOR) 20 MG tablet Take 20 mg by mouth daily.     Marland Kitchen HYDROcodone-acetaminophen (NORCO) 5-325 MG tablet Take 1 tablet by mouth every 6 (six) hours as needed for severe pain. 10 tablet 0   . levothyroxine (SYNTHROID) 100 MCG tablet Take 100 mcg by mouth every morning.     . ondansetron (ZOFRAN ODT) 4 MG disintegrating tablet Take 1 tablet (4 mg total) by mouth every 8 (eight) hours as needed for nausea or vomiting. 10 tablet 0    Allergies  Allergen Reactions  . Cucumber Extract Other (See Comments)    Vomiting  . Strawberry (Diagnostic) Other (See Comments)    Vomiting      ROS:  A  comprehensive review of systems was negative except for: Gastrointestinal: positive for abdominal pain, nausea and vomiting  Blood pressure (!) 177/94, pulse 78, temperature 98.1 F (36.7 C), temperature source Oral, resp. rate 18, height 5' (1.524 m), weight 54.9 kg, SpO2 96 %. Physical Exam Vitals reviewed.  Constitutional:      Appearance: She is normal weight.  HENT:     Head: Normocephalic.     Nose: Nose normal.     Mouth/Throat:     Mouth: Mucous membranes are moist.  Eyes:     Extraocular Movements: Extraocular movements intact.  Cardiovascular:     Rate and Rhythm: Normal rate and regular rhythm.  Pulmonary:     Effort: Pulmonary effort is normal.     Breath sounds: Normal breath sounds.  Abdominal:     General: There is no distension.     Palpations: Abdomen is soft.     Tenderness: There is abdominal tenderness in the right upper quadrant and epigastric area.  Musculoskeletal:        General: No swelling. Normal range of motion.     Cervical back: No rigidity.  Skin:    General: Skin is warm.  Neurological:     General: No focal deficit present.     Mental Status: She is alert and oriented to person, place, and time.  Psychiatric:        Mood and Affect: Mood normal.        Behavior: Behavior normal.        Thought Content: Thought content normal.        Judgment: Judgment normal.     Results: Results for orders placed or performed during the hospital encounter of 02/12/20 (from the past 48 hour(s))  Comprehensive metabolic panel     Status: Abnormal   Collection Time: 02/12/20  8:00 AM  Result Value Ref Range   Sodium 137 135 - 145 mmol/L   Potassium 3.8 3.5 - 5.1 mmol/L   Chloride 100 98 - 111 mmol/L   CO2 23 22 - 32 mmol/L   Glucose, Bld 126 (H) 70 - 99 mg/dL    Comment: Glucose reference range applies only to samples taken after fasting for at least 8 hours.   BUN 9 8 - 23 mg/dL   Creatinine, Ser 0.65 0.44 - 1.00 mg/dL   Calcium 9.9 8.9 - 10.3 mg/dL    Total  Protein 7.5 6.5 - 8.1 g/dL   Albumin 4.7 3.5 - 5.0 g/dL   AST 23 15 - 41 U/L   ALT 21 0 - 44 U/L   Alkaline Phosphatase 110 38 - 126 U/L   Total Bilirubin 0.9 0.3 - 1.2 mg/dL   GFR calc non Af Amer >60 >60 mL/min   GFR calc Af Amer >60 >60 mL/min   Anion gap 14 5 - 15    Comment: Performed at [facility], [address], [place], [place] [address]  Lipase, blood     Status: None   Collection Time: 02/12/20  8:00 AM  Result Value Ref Range   Lipase 28 11 - 51 U/L    Comment: Performed at [facility], [address], [place], [place] [address]  Troponin I (High Sensitivity)     Status: None   Collection Time: 02/12/20  8:00 AM  Result Value Ref Range   Troponin I (High Sensitivity) 6 <18 ng/L    Comment: (NOTE) Elevated high sensitivity troponin I (hsTnI) values and significant  changes across serial measurements may suggest ACS but many other  chronic and acute conditions are known to elevate hsTnI results.  Refer to the "Links" section for chest pain algorithms and additional  guidance. Performed at [facility], [address] [place], [place] [address]   CBC with Differential/Platelet     Status: Abnormal   Collection Time: 02/12/20  9:47 AM  Result Value Ref Range   WBC 7.1 4.0 - 10.5 K/uL   RBC 5.26 (H) 3.87 - 5.11 MIL/uL   Hemoglobin 15.8 (H) 12.0 - 15.0 g/dL   HCT 47.2 (H) 36 - 46 %   MCV 89.7 80.0 - 100.0 fL   MCH 30.0 26.0 - 34.0 pg   MCHC 33.5 30.0 - 36.0 g/dL   RDW 12.6 11.5 - 15.5 %   Platelets 317 150 - 400 K/uL   nRBC 0.0 0.0 - 0.2 %   Neutrophils Relative % 80 %   Neutro Abs 5.6 1.7 - 7.7 K/uL   Lymphocytes Relative 16 %   Lymphs Abs 1.2 0.7 - 4.0 K/uL   Monocytes Relative 4 %   Monocytes Absolute 0.3 0 - 1 K/uL   Eosinophils Relative 0 %   Eosinophils Absolute 0.0 0 - 0 K/uL   Basophils Relative 0 %   Basophils Absolute 0.0 0 - 0 K/uL   Immature Granulocytes 0 %   Abs Immature Granulocytes 0.02 0.00 - 0.07 K/uL    Comment: Performed at [facility], [address] [place], [place] [address]   Personally reviewed -Stones on [place] and no CBD dilation  US Abdomen Limited RUQ  Result Date: 02/12/2020 CLINICAL DATA:  Right upper quadrant abdominal pain with nausea and vomiting EXAM: ULTRASOUND ABDOMEN LIMITED RIGHT UPPER QUADRANT COMPARISON:  02/11/2020 abdominal CT FINDINGS: Gallbladder: Multiple gallstones.  No wall thickening or focal tenderness. Common bile duct: Diameter: 2 mm Liver: No focal lesion identified. Within normal limits in parenchymal echogenicity. Portal vein is patent on color Doppler imaging with normal direction of blood flow towards the liver. IMPRESSION: Cholelithiasis without findings of cholecystitis. Electronically Signed   By: Monte Fantasia M.D.   On: 02/12/2020 08:44     Assessment & Plan:  Marci Polito is a 86 y.o. female with symptomatic cholelithiasis and intractable nausea/vomiting.   -PLAN: I counseled the patient about the indication, risks and benefits of laparoscopic cholecystectomy.  She understands there is a very small chance for  bleeding, infection, injury to normal structures (including common bile duct), conversion to open surgery, persistent symptoms, evolution of postcholecystectomy diarrhea, need for secondary interventions, anesthesia reaction, cardiopulmonary issues and other risks not specifically detailed here. I described the expected recovery, the plan for follow-up and the restrictions during the recovery phase.  All questions were answered.  -COVID ordered and pending  -EKG repeat ordered and troponin given ? Elevation this AM, no chest pain or SOB reported -CXR preop -Labs in AM  -LR @ 50   All questions were answered to the satisfaction of the patient and family.    Virl Cagey 02/12/2020, 7:36 PM

## 2020-02-12 NOTE — ED Triage Notes (Signed)
Pt c/o of n/v. Scheduled to have gallstones removed. States her daughter talked to surgery and was told to come to ED for admission

## 2020-02-12 NOTE — ED Triage Notes (Signed)
Pt had a CT scan yesterday at [place]. Was diagnosed with gallstones. She was scheduled for an ultrasound today at the [facility]. She stated she felt very sick and was throwing up and could not drive herself to the office.

## 2020-02-12 NOTE — ED Notes (Signed)
Pt noted to be a hard stick and blood draw. Several nurses attempted.

## 2020-02-12 NOTE — Discharge Instructions (Signed)
If you develop worsening, continued, or recurrent abdominal pain, uncontrolled vomiting, fever, chest or back pain, or any other new/concerning symptoms then return to the ER for evaluation.  

## 2020-02-12 NOTE — ED Provider Notes (Signed)
[place] Provider Note   CSN: [id number] Arrival date & time: 02/12/20  0730     History Chief Complaint  Patient presents with  . Abdominal Pain    Olivia Haas is a 86 y.o. female.  HPI 86 year old female with 2 days of abdominal pain.  It is umbilicus and above.  Mostly on her right side.  Called her PCP and was sent to the [facility] ER yesterday where she had a CT scan.  She was told she had gallstones.  Due for an ultrasound this morning but last night the pain was unrelenting and she had multiple episodes of dry heaving.  No fevers.  She has some epigastric discomfort but denies any specific chest pain but is worried about her heart as well.  No shortness of breath.  Pain has been a 7 and is hard to describe quality of pain.  Currently there is 0 pain. Pain has also been hurting in her right back.  Past Medical History:  Diagnosis Date  . High cholesterol   . Thyroid disease     Patient Active Problem List   Diagnosis Date Noted  . History of lumbar laminectomy for spinal cord decompression 12/04/2019    Past Surgical History:  Procedure Laterality Date  . ABDOMINAL HYSTERECTOMY    . TONSILLECTOMY       OB History   No obstetric history on file.     No family history on file.  Social History   Tobacco Use  . Smoking status: Never Smoker  . Smokeless tobacco: Never Used  Vaping Use  . Vaping Use: Never used  Substance Use Topics  . Alcohol use: No  . Drug use: No    Home Medications Prior to Admission medications   Medication Sig Start Date End Date Taking? Authorizing Provider  atorvastatin (LIPITOR) 20 MG tablet Take 20 mg by mouth daily. 11/18/19  Yes [provider]  levothyroxine (SYNTHROID) 100 MCG tablet Take 100 mcg by mouth every morning. 11/20/19  Yes [provider]  HYDROcodone-acetaminophen (NORCO) 5-325 MG tablet Take 1 tablet by mouth every 6 (six) hours as needed for severe pain. 02/12/20    Sherwood Gambler, MD  ondansetron (ZOFRAN ODT) 4 MG disintegrating tablet Take 1 tablet (4 mg total) by mouth every 8 (eight) hours as needed for nausea or vomiting. 02/12/20   Sherwood Gambler, MD    Allergies    Cucumber extract and Strawberry (diagnostic)  Review of Systems   Review of Systems  Constitutional: Negative for fever.  Respiratory: Negative for shortness of breath.   Cardiovascular: Negative for chest pain.  Gastrointestinal: Positive for abdominal pain and vomiting.  All other systems reviewed and are negative.   Physical Exam Updated Vital Signs BP 136/71 (BP Location: Left Arm)   Pulse 74   Temp 98.3 F (36.8 C) (Oral)   Resp 12   Ht 5' (1.524 m)   Wt 54.9 kg   SpO2 94%   BMI 23.63 kg/m   Physical Exam Vitals and nursing note reviewed.  Constitutional:      General: She is not in acute distress.    Appearance: She is well-developed. She is not ill-appearing or diaphoretic.  HENT:     Head: Normocephalic and atraumatic.     Right Ear: External ear normal.     Left Ear: External ear normal.     Nose: Nose normal.  Eyes:     General:  Right eye: No discharge.        Left eye: No discharge.  Cardiovascular:     Rate and Rhythm: Normal rate and regular rhythm.     Heart sounds: Normal heart sounds.  Pulmonary:     Effort: Pulmonary effort is normal.     Breath sounds: Normal breath sounds.  Abdominal:     Palpations: Abdomen is soft.     Tenderness: There is no abdominal tenderness. There is no right CVA tenderness or left CVA tenderness.  Skin:    General: Skin is warm and dry.  Neurological:     Mental Status: She is alert.  Psychiatric:        Mood and Affect: Mood is not anxious.     ED Results / Procedures / Treatments   Labs (all labs ordered are listed, but only abnormal results are displayed) Labs Reviewed  COMPREHENSIVE METABOLIC PANEL - Abnormal; Notable for the following components:      Result Value   Glucose, Bld 126 (*)      All other components within normal limits  CBC WITH DIFFERENTIAL/PLATELET - Abnormal; Notable for the following components:   RBC 5.26 (*)    Hemoglobin 15.8 (*)    HCT 47.2 (*)    All other components within normal limits  LIPASE, BLOOD  CBC WITH DIFFERENTIAL/PLATELET  TROPONIN I (HIGH SENSITIVITY)    EKG EKG Interpretation  Date/Time:  Thursday February 12 2020 07:50:17 EDT Ventricular Rate:  81 PR Interval:    QRS Duration: 87 QT Interval:  410 QTC Calculation: 476 R Axis:   -20 Text Interpretation: Sinus rhythm Borderline left axis deviation Interpretation limited secondary to artifact otherwise, no acute ischemia Confirmed by Sherwood Gambler [phone]) on 02/12/2020 8:41:41 AM   Radiology US Abdomen Limited RUQ  Result Date: 02/12/2020 CLINICAL DATA:  Right upper quadrant abdominal pain with nausea and vomiting EXAM: ULTRASOUND ABDOMEN LIMITED RIGHT UPPER QUADRANT COMPARISON:  02/11/2020 abdominal CT FINDINGS: Gallbladder: Multiple gallstones.  No wall thickening or focal tenderness. Common bile duct: Diameter: 2 mm Liver: No focal lesion identified. Within normal limits in parenchymal echogenicity. Portal vein is patent on color Doppler imaging with normal direction of blood flow towards the liver. IMPRESSION: Cholelithiasis without findings of cholecystitis. Electronically Signed   By: Monte Fantasia M.D.   On: 02/12/2020 08:44    Procedures Ultrasound ED Peripheral IV (Provider)  Date/Time: 02/12/2020 9:46 AM Performed by: Sherwood Gambler, MD Authorized by: Sherwood Gambler, MD   Procedure details:    Indications: multiple failed IV attempts and poor IV access     Location:  Left AC   Angiocath:  20 G   Bedside Ultrasound Guided: Yes     Patient tolerated procedure without complications: Yes     Dressing applied: Yes     (including critical care time)  Medications Ordered in ED Medications  sodium chloride 0.9 % bolus 500 mL (500 mLs Intravenous New Bag/Given 02/12/20  0849)    ED Course  I have reviewed the triage vital signs and the nursing notes.  Pertinent labs & imaging results that were available during my care of the patient were reviewed by me and considered in my medical decision making (see chart for details).    MDM Rules/Calculators/A&P                          Patient's labs and ultrasound images were personally reviewed.  Has cholelithiasis without cholecystitis.  She  has not required any pain medicine and feels well now.  Given this, extremely low suspicion for cholecystitis.  Does not appear to need emergency surgery.  Discussed with patient and daughter, will give short course of pain and nausea meds as prescription and have her follow-up with outpatient general surgery.  Return precautions discussed.  CT report from yesterday was reviewed and was otherwise unremarkable. Final Clinical Impression(s) / ED Diagnoses Final diagnoses:  RUQ abdominal pain  Calculus of gallbladder without cholecystitis without obstruction    Rx / DC Orders ED Discharge Orders         Ordered    HYDROcodone-acetaminophen (NORCO) 5-325 MG tablet  Every 6 hours PRN     Discontinue  Reprint     02/12/20 1023    ondansetron (ZOFRAN ODT) 4 MG disintegrating tablet  Every 8 hours PRN     Discontinue  Reprint     02/12/20 1023           Sherwood Gambler, MD 02/12/20 1025

## 2020-02-12 NOTE — ED Notes (Signed)
Pt's daughter is Gregary Cromer and she can be reached at [phone] and would like to be called for any changes.

## 2020-02-13 ENCOUNTER — Observation Stay (HOSPITAL_COMMUNITY): Payer: Medicare Other | Admitting: Anesthesiology

## 2020-02-13 ENCOUNTER — Encounter (HOSPITAL_COMMUNITY): Payer: Self-pay | Admitting: General Surgery

## 2020-02-13 ENCOUNTER — Encounter (HOSPITAL_COMMUNITY)
Admission: EM | Disposition: A | Discharge status: Home / Self Care | Payer: Self-pay | Source: Home / Self Care | Attending: General Surgery

## 2020-02-13 DIAGNOSIS — K8070 Calculus of gallbladder and bile duct without cholecystitis without obstruction: Secondary | ICD-10-CM | POA: Diagnosis present

## 2020-02-13 DIAGNOSIS — R112 Nausea with vomiting, unspecified: Secondary | ICD-10-CM | POA: Diagnosis not present

## 2020-02-13 DIAGNOSIS — B029 Zoster without complications: Secondary | ICD-10-CM | POA: Diagnosis present

## 2020-02-13 DIAGNOSIS — Z79890 Hormone replacement therapy (postmenopausal): Secondary | ICD-10-CM | POA: Diagnosis not present

## 2020-02-13 DIAGNOSIS — K8020 Calculus of gallbladder without cholecystitis without obstruction: Secondary | ICD-10-CM | POA: Diagnosis not present

## 2020-02-13 DIAGNOSIS — R030 Elevated blood-pressure reading, without diagnosis of hypertension: Secondary | ICD-10-CM | POA: Diagnosis present

## 2020-02-13 DIAGNOSIS — E079 Disorder of thyroid, unspecified: Secondary | ICD-10-CM | POA: Diagnosis present

## 2020-02-13 DIAGNOSIS — E876 Hypokalemia: Secondary | ICD-10-CM | POA: Diagnosis present

## 2020-02-13 DIAGNOSIS — Z90710 Acquired absence of both cervix and uterus: Secondary | ICD-10-CM | POA: Diagnosis not present

## 2020-02-13 DIAGNOSIS — Z20822 Contact with and (suspected) exposure to covid-19: Secondary | ICD-10-CM | POA: Diagnosis present

## 2020-02-13 DIAGNOSIS — E871 Hypo-osmolality and hyponatremia: Secondary | ICD-10-CM | POA: Diagnosis present

## 2020-02-13 DIAGNOSIS — Z79899 Other long term (current) drug therapy: Secondary | ICD-10-CM | POA: Diagnosis not present

## 2020-02-13 DIAGNOSIS — K8050 Calculus of bile duct without cholangitis or cholecystitis without obstruction: Secondary | ICD-10-CM | POA: Diagnosis present

## 2020-02-13 DIAGNOSIS — K8010 Calculus of gallbladder with chronic cholecystitis without obstruction: Secondary | ICD-10-CM | POA: Diagnosis not present

## 2020-02-13 HISTORY — PX: CHOLECYSTECTOMY: SHX55

## 2020-02-13 LAB — SURGICAL PCR SCREEN
MRSA, PCR: NEGATIVE
Staphylococcus aureus: NEGATIVE

## 2020-02-13 LAB — COMPREHENSIVE METABOLIC PANEL
ALT: 20 U/L (ref 0–44)
AST: 21 U/L (ref 15–41)
Albumin: 4.4 g/dL (ref 3.5–5.0)
Alkaline Phosphatase: 93 U/L (ref 38–126)
Anion gap: 13 (ref 5–15)
BUN: 14 mg/dL (ref 8–23)
CO2: 22 mmol/L (ref 22–32)
Calcium: 9.4 mg/dL (ref 8.9–10.3)
Chloride: 104 mmol/L (ref 98–111)
Creatinine, Ser: 0.68 mg/dL (ref 0.44–1.00)
GFR calc Af Amer: >60 mL/min (ref >60)
GFR calc non Af Amer: >60 mL/min (ref >60)
Glucose, Bld: 128 mg/dL — ABNORMAL HIGH (ref 70–99)
Potassium: 3.1 mmol/L — ABNORMAL LOW (ref 3.5–5.1)
Sodium: 139 mmol/L (ref 135–145)
Total Bilirubin: 1.1 mg/dL (ref 0.3–1.2)
Total Protein: 7 g/dL (ref 6.5–8.1)

## 2020-02-13 LAB — CBC WITH DIFFERENTIAL/PLATELET
Abs Immature Granulocytes: 0.03 10*3/uL (ref 0.00–0.07)
Basophils Absolute: 0 10*3/uL (ref 0.0–0.1)
Basophils Relative: 0 %
Eosinophils Absolute: 0 10*3/uL (ref 0.0–0.5)
Eosinophils Relative: 0 %
HCT: 41.2 % (ref 36.0–46.0)
Hemoglobin: 13.5 g/dL (ref 12.0–15.0)
Immature Granulocytes: 0 %
Lymphocytes Relative: 16 %
Lymphs Abs: 1.6 10*3/uL (ref 0.7–4.0)
MCH: 29.6 pg (ref 26.0–34.0)
MCHC: 32.8 g/dL (ref 30.0–36.0)
MCV: 90.4 fL (ref 80.0–100.0)
Monocytes Absolute: 0.7 10*3/uL (ref 0.1–1.0)
Monocytes Relative: 7 %
Neutro Abs: 7.8 10*3/uL — ABNORMAL HIGH (ref 1.7–7.7)
Neutrophils Relative %: 77 %
Platelets: 337 10*3/uL (ref 150–400)
RBC: 4.56 MIL/uL (ref 3.87–5.11)
RDW: 12.7 % (ref 11.5–15.5)
WBC: 10.1 10*3/uL (ref 4.0–10.5)
nRBC: 0 % (ref 0.0–0.2)

## 2020-02-13 SURGERY — LAPAROSCOPIC CHOLECYSTECTOMY
Anesthesia: General

## 2020-02-13 MED ORDER — ONDANSETRON HCL 4 MG/2ML IJ SOLN
4.0000 mg | Freq: Once | INTRAMUSCULAR | Status: AC
  Administered 2020-02-13: 4 mg via INTRAVENOUS

## 2020-02-13 MED ORDER — ORAL CARE MOUTH RINSE: 15.0000 mL | Freq: Once | OROMUCOSAL | Status: AC

## 2020-02-13 MED ORDER — LIDOCAINE HCL (CARDIAC) PF 100 MG/5ML IV SOSY
PREFILLED_SYRINGE | INTRAVENOUS | Status: DC | PRN
  Administered 2020-02-13: 60 mg via INTRAVENOUS

## 2020-02-13 MED ORDER — SODIUM CHLORIDE 0.9 % IR SOLN
Status: DC | PRN
  Administered 2020-02-13: 1000 mL

## 2020-02-13 MED ORDER — ONDANSETRON HCL 4 MG/2ML IJ SOLN
INTRAMUSCULAR | Status: DC | PRN
  Administered 2020-02-13: 4 mg via INTRAVENOUS

## 2020-02-13 MED ORDER — PROPOFOL 10 MG/ML IV BOLUS
INTRAVENOUS | Status: DC | PRN
  Administered 2020-02-13: 120 mg via INTRAVENOUS

## 2020-02-13 MED ORDER — SODIUM CHLORIDE 0.9 % IV SOLN
INTRAVENOUS | Status: AC
  Filled 2020-02-13: qty 2

## 2020-02-13 MED ORDER — FENTANYL CITRATE (PF) 100 MCG/2ML IJ SOLN
INTRAMUSCULAR | Status: DC | PRN
  Administered 2020-02-13 (×2): 100 ug via INTRAVENOUS

## 2020-02-13 MED ORDER — CHLORHEXIDINE GLUCONATE 0.12 % MT SOLN
15.0000 mL | Freq: Once | OROMUCOSAL | Status: AC
  Administered 2020-02-13: 15 mL via OROMUCOSAL

## 2020-02-13 MED ORDER — FENTANYL CITRATE (PF) 100 MCG/2ML IJ SOLN
INTRAMUSCULAR | Status: AC
  Filled 2020-02-13: qty 2

## 2020-02-13 MED ORDER — LACTATED RINGERS IV SOLN: INTRAVENOUS | Status: DC | PRN

## 2020-02-13 MED ORDER — ROCURONIUM BROMIDE 100 MG/10ML IV SOLN
INTRAVENOUS | Status: DC | PRN
  Administered 2020-02-13: 40 mg via INTRAVENOUS

## 2020-02-13 MED ORDER — ONDANSETRON HCL 4 MG/2ML IJ SOLN
INTRAMUSCULAR | Status: AC
  Filled 2020-02-13: qty 2

## 2020-02-13 MED ORDER — HEMOSTATIC AGENTS (NO CHARGE) OPTIME
TOPICAL | Status: DC | PRN
  Administered 2020-02-13: 1 via TOPICAL

## 2020-02-13 MED ORDER — LACTATED RINGERS IV SOLN: INTRAVENOUS | Status: DC

## 2020-02-13 MED ORDER — DEXAMETHASONE SODIUM PHOSPHATE 10 MG/ML IJ SOLN
INTRAMUSCULAR | Status: DC | PRN
  Administered 2020-02-13: 4 mg via INTRAVENOUS

## 2020-02-13 MED ORDER — LABETALOL HCL 5 MG/ML IV SOLN
INTRAVENOUS | Status: AC
  Filled 2020-02-13: qty 4

## 2020-02-13 MED ORDER — PROPOFOL 10 MG/ML IV BOLUS
INTRAVENOUS | Status: AC
  Filled 2020-02-13: qty 20

## 2020-02-13 MED ORDER — SUGAMMADEX SODIUM 500 MG/5ML IV SOLN
INTRAVENOUS | Status: DC | PRN
  Administered 2020-02-13: 200 mg via INTRAVENOUS

## 2020-02-13 MED ORDER — FENTANYL CITRATE (PF) 100 MCG/2ML IJ SOLN: 25.0000 ug | INTRAMUSCULAR | Status: DC | PRN

## 2020-02-13 MED ORDER — BUPIVACAINE HCL (PF) 0.5 % IJ SOLN
INTRAMUSCULAR | Status: AC
  Filled 2020-02-13: qty 30

## 2020-02-13 MED ORDER — ONDANSETRON HCL 4 MG/2ML IJ SOLN: 4.0000 mg | Freq: Once | INTRAMUSCULAR | Status: DC | PRN

## 2020-02-13 MED ORDER — LABETALOL HCL 5 MG/ML IV SOLN
INTRAVENOUS | Status: DC | PRN
  Administered 2020-02-13: 10 mg via INTRAVENOUS

## 2020-02-13 MED ORDER — SODIUM CHLORIDE 0.9 % IV SOLN
2.0000 g | INTRAVENOUS | Status: AC
  Administered 2020-02-13: 2 g via INTRAVENOUS

## 2020-02-13 MED ORDER — BUPIVACAINE HCL (PF) 0.5 % IJ SOLN
INTRAMUSCULAR | Status: DC | PRN
  Administered 2020-02-13: 10 mL

## 2020-02-13 SURGICAL SUPPLY — 45 items
APPLIER CLIP ROT 10 11.4 M/L (STAPLE) ×2
BAG RETRIEVAL 10 (BASKET) ×1
BLADE SURG 15 STRL LF DISP TIS (BLADE) ×1 IMPLANT
BLADE SURG 15 STRL SS (BLADE) ×2
CHLORAPREP W/TINT 26 (MISCELLANEOUS) ×2 IMPLANT
CLIP APPLIE ROT 10 11.4 M/L (STAPLE) ×1 IMPLANT
CLOTH BEACON ORANGE TIMEOUT ST (SAFETY) ×2 IMPLANT
COVER LIGHT HANDLE STERIS (MISCELLANEOUS) ×4 IMPLANT
COVER WAND RF STERILE (DRAPES) ×2 IMPLANT
DECANTER SPIKE VIAL GLASS SM (MISCELLANEOUS) ×2 IMPLANT
DERMABOND ADVANCED (GAUZE/BANDAGES/DRESSINGS) ×1
DERMABOND ADVANCED .7 DNX12 (GAUZE/BANDAGES/DRESSINGS) ×1 IMPLANT
ELECT REM PT RETURN 9FT ADLT (ELECTROSURGICAL) ×2
ELECTRODE REM PT RTRN 9FT ADLT (ELECTROSURGICAL) ×1 IMPLANT
GLOVE BIO SURGEON STRL SZ 6.5 (GLOVE) ×4 IMPLANT
GLOVE BIO SURGEON STRL SZ7.5 (GLOVE) ×2 IMPLANT
GLOVE BIOGEL PI IND STRL 6.5 (GLOVE) ×2 IMPLANT
GLOVE BIOGEL PI IND STRL 7.0 (GLOVE) ×3 IMPLANT
GLOVE BIOGEL PI IND STRL 7.5 (GLOVE) ×1 IMPLANT
GLOVE BIOGEL PI INDICATOR 6.5 (GLOVE) ×2
GLOVE BIOGEL PI INDICATOR 7.0 (GLOVE) ×3
GLOVE BIOGEL PI INDICATOR 7.5 (GLOVE) ×1
GLOVE SURG SS PI 7.5 STRL IVOR (GLOVE) ×2 IMPLANT
GOWN STRL REUS W/ TWL LRG LVL3 (GOWN DISPOSABLE) ×1 IMPLANT
GOWN STRL REUS W/TWL LRG LVL3 (GOWN DISPOSABLE) ×8 IMPLANT
HEMOSTAT SNOW SURGICEL 2X4 (HEMOSTASIS) ×2 IMPLANT
INST SET LAPROSCOPIC AP (KITS) ×2 IMPLANT
KIT TURNOVER KIT A (KITS) ×2 IMPLANT
MANIFOLD NEPTUNE II (INSTRUMENTS) ×2 IMPLANT
NEEDLE INSUFFLATION 14GA 120MM (NEEDLE) ×2 IMPLANT
NS IRRIG 1000ML POUR BTL (IV SOLUTION) ×2 IMPLANT
PACK LAP CHOLE LZT030E (CUSTOM PROCEDURE TRAY) ×2 IMPLANT
PAD ARMBOARD 7.5X6 YLW CONV (MISCELLANEOUS) ×2 IMPLANT
SET BASIN LINEN APH (SET/KITS/TRAYS/PACK) ×2 IMPLANT
SET TUBE SMOKE EVAC HIGH FLOW (TUBING) ×2 IMPLANT
SLEEVE ENDOPATH XCEL 5M (ENDOMECHANICALS) ×2 IMPLANT
SUT MNCRL AB 4-0 PS2 18 (SUTURE) ×4 IMPLANT
SUT VICRYL 0 UR6 27IN ABS (SUTURE) ×2 IMPLANT
SYS BAG RETRIEVAL 10MM (BASKET) ×1
SYSTEM BAG RETRIEVAL 10MM (BASKET) ×1 IMPLANT
TROCAR ENDO BLADELESS 11MM (ENDOMECHANICALS) ×2 IMPLANT
TROCAR XCEL NON-BLD 5MMX100MML (ENDOMECHANICALS) ×2 IMPLANT
TROCAR XCEL UNIV SLVE 11M 100M (ENDOMECHANICALS) ×2 IMPLANT
TUBE CONNECTING 12X1/4 (SUCTIONS) ×2 IMPLANT
WARMER LAPAROSCOPE (MISCELLANEOUS) ×2 IMPLANT

## 2020-02-13 NOTE — Interval H&P Note (Signed)
History and Physical Interval Note:  02/13/2020 12:42 PM  Olivia Haas  has presented today for surgery, with the diagnosis of biliary colic,nausea and vomiting.  The various methods of treatment have been discussed with the patient and family. After consideration of risks, benefits and other options for treatment, the patient has consented to  Procedure(s): LAPAROSCOPIC CHOLECYSTECTOMY (N/A) as a surgical intervention.  The patient's history has been reviewed, patient examined, no change in status, stable for surgery.  I have reviewed the patient's chart and labs.  Questions were answered to the patient's satisfaction.    No changes. Still with nausea. EKG was SR and repeat troponin still negative. No chest pain.  Virl Cagey

## 2020-02-13 NOTE — Progress Notes (Signed)
From pacu, alert and oriented.  Four lap chole sites intact with dermabond.  Daughters at bedside and assisted with ambulation to bathroom for voiding. Drinking gingerale.

## 2020-02-13 NOTE — Plan of Care (Signed)

## 2020-02-13 NOTE — Anesthesia Procedure Notes (Signed)
Procedure Name: Intubation Date/Time: 02/13/2020 1:42 PM Performed by: Jonna Munro, CRNA Pre-anesthesia Checklist: Patient identified, Emergency Drugs available, Suction available, Patient being monitored and Timeout performed Patient Re-evaluated:Patient Re-evaluated prior to induction Oxygen Delivery Method: Circle system utilized Preoxygenation: Pre-oxygenation with 100% oxygen Induction Type: IV induction Ventilation: Mask ventilation without difficulty Laryngoscope Size: Mac and 3 Grade View: Grade I Tube type: Oral Tube size: 7.0 mm Number of attempts: 1 Airway Equipment and Method: Stylet Placement Confirmation: positive ETCO2,  ETT inserted through vocal cords under direct vision and breath sounds checked- equal and bilateral Secured at: 21 cm Tube secured with: Tape Dental Injury: Teeth and Oropharynx as per pre-operative assessment

## 2020-02-13 NOTE — Anesthesia Postprocedure Evaluation (Signed)
Anesthesia Post Note  Patient: Olivia Haas  Procedure(s) Performed: LAPAROSCOPIC CHOLECYSTECTOMY (N/A )  Patient location during evaluation: PACU Anesthesia Type: General Level of consciousness: awake and alert and patient cooperative Pain management: satisfactory to patient Vital Signs Assessment: post-procedure vital signs reviewed and stable Respiratory status: spontaneous breathing Cardiovascular status: stable Postop Assessment: no apparent nausea or vomiting Anesthetic complications: no   No complications documented.   Last Vitals:  Vitals:   02/13/20 1500 02/13/20 1515  BP: (!) 152/121 (!) 178/75  Pulse: 73 65  Resp: 20 20  Temp:    SpO2: 97% 95%    Last Pain:  Vitals:   02/13/20 1515  TempSrc:   PainSc: 0-No pain                 Caelen Reierson

## 2020-02-13 NOTE — Op Note (Signed)
Operative Note   Preoperative Diagnosis: Symptomatic cholelithiasis, intractable nausea and vomiting.    Postoperative Diagnosis: Same   Procedure(s) Performed: Laparoscopic cholecystectomy   Surgeon: Ria Comment C. Constance Haw, MD   Assistants: Aviva Signs, MD    Anesthesia: General endotracheal   Anesthesiologist: Dr. Briant Cedar, MD    Specimens: Gallbladder    Estimated Blood Loss: Minimal    Blood Replacement: None    Complications: None    Operative Findings: Normal appearing gallbladder    Procedure: The patient was taken to the operating room and placed supine. General endotracheal anesthesia was induced. Intravenous antibiotics were administered per protocol. An orogastric tube positioned to decompress the stomach. The abdomen was prepared and draped in the usual sterile fashion.    A supraumbilical incision was made and a Veress technique was utilized to achieve pneumoperitoneum to 15 mmHg with carbon dioxide. A 11 mm optiview port was placed through the supraumbilical region, and a 10 mm 0-degree operative laparoscope was introduced. The area underlying the trocar and Veress needle were inspected and without evidence of injury.  Remaining trocars were placed under direct vision. Two 5 mm ports were placed in the right abdomen, between the anterior axillary and midclavicular line.  A final 11 mm port was placed through the mid-epigastrium, near the falciform ligament.    The gallbladder fundus was elevated cephalad and the infundibulum was retracted to the patient's right. The gallbladder/cystic duct junction was skeletonized. The cystic artery noted in the triangle of Calot and was also skeletonized.  We then continued liberal medial and lateral dissection until the critical view of safety was achieved.    The cystic duct was triply clipped and cystic artery was doubly clipped and both were divided. The gallbladder was then dissected from the liver bed with electrocautery. There was a  small posterior vessel in the mesentery that was clipped once. The specimen was placed in an Endopouch and was retrieved through the epigastric site.   Final inspection revealed acceptable hemostasis. Surgical SNOW was placed in the gallbladder bed.  Trocars were removed and pneumoperitoneum was released.  0 Vicryl fascial sutures as used to close umbilical port site and the epigastric site was over the rib. Skin incisions were closed with 4-0 Monocryl subcuticular sutures and Dermabond. The patient was awakened from anesthesia and extubated without complication.    Curlene Labrum, MD [facility] [address] [place], [place] [address] [phone] (office)

## 2020-02-13 NOTE — Anesthesia Preprocedure Evaluation (Addendum)
Anesthesia Evaluation  Patient identified by MRN, date of birth, ID band Patient awake    Reviewed: Allergy & Precautions, H&P , NPO status , Patient's Chart, lab work & pertinent test results, reviewed documented beta blocker date and time   Airway Mallampati: II  TM Distance: >3 FB Neck ROM: full    Dental no notable dental hx.    Pulmonary neg pulmonary ROS,    Pulmonary exam normal breath sounds clear to auscultation       Cardiovascular Exercise Tolerance: Good negative cardio ROS   Rhythm:regular Rate:Normal     Neuro/Psych negative neurological ROS  negative psych ROS   GI/Hepatic negative GI ROS, Neg liver ROS,   Endo/Other  negative endocrine ROS  Renal/GU negative Renal ROS  negative genitourinary   Musculoskeletal   Abdominal   Peds  Hematology negative hematology ROS (+)   Anesthesia Other Findings   Reproductive/Obstetrics negative OB ROS                             Anesthesia Physical Anesthesia Plan  ASA: II  Anesthesia Plan: General   Post-op Pain Management:    Induction:   PONV Risk Score and Plan: 3 and Ondansetron  Airway Management Planned:   Additional Equipment:   Intra-op Plan:   Post-operative Plan:   Informed Consent: I have reviewed the patients History and Physical, chart, labs and discussed the procedure including the risks, benefits and alternatives for the proposed anesthesia with the patient or authorized representative who has indicated his/her understanding and acceptance.     Dental Advisory Given  Plan Discussed with: CRNA  Anesthesia Plan Comments:         Anesthesia Quick Evaluation

## 2020-02-13 NOTE — Transfer of Care (Signed)
Immediate Anesthesia Transfer of Care Note  Patient: Olivia Haas  Procedure(s) Performed: LAPAROSCOPIC CHOLECYSTECTOMY (N/A )  Patient Location: PACU  Anesthesia Type:General  Level of Consciousness: awake, alert , oriented and patient cooperative  Airway & Oxygen Therapy: Patient Spontanous Breathing and Patient connected to nasal cannula oxygen  Post-op Assessment: Report given to RN, Post -op Vital signs reviewed and stable and Patient moving all extremities X 4  Post vital signs: Reviewed and stable  Last Vitals:  Vitals Value Taken Time  BP 140/88 02/13/20 1441  Temp    Pulse 81 02/13/20 1444  Resp 17 02/13/20 1444  SpO2 96 % 02/13/20 1444  Vitals shown include unvalidated device data.  Last Pain:  Vitals:   02/13/20 1200  TempSrc: Oral  PainSc: 5       Patients Stated Pain Goal: 5 (48/35/07 5732)  Complications: No complications documented.

## 2020-02-13 NOTE — Progress Notes (Signed)
Patient's IV is infiltrated, Pt.got stuck many times and unable to establish an IV line.  Notified Agricultural consultant and [employer]. Nancy,RN  will follow-up on placing IV and will call day shift AC. Pt medicated with Zofran ODT for N/V  and oxycodone for the abd. pain.

## 2020-02-14 LAB — BASIC METABOLIC PANEL
Anion gap: 14 (ref 5–15)
BUN: 14 mg/dL (ref 8–23)
CO2: 22 mmol/L (ref 22–32)
Calcium: 9.2 mg/dL (ref 8.9–10.3)
Chloride: 100 mmol/L (ref 98–111)
Creatinine, Ser: 0.69 mg/dL (ref 0.44–1.00)
GFR calc Af Amer: >60 mL/min (ref >60)
GFR calc non Af Amer: >60 mL/min (ref >60)
Glucose, Bld: 116 mg/dL — ABNORMAL HIGH (ref 70–99)
Potassium: 2.8 mmol/L — ABNORMAL LOW (ref 3.5–5.1)
Sodium: 136 mmol/L (ref 135–145)

## 2020-02-14 LAB — URINALYSIS, COMPLETE (UACMP) WITH MICROSCOPIC
Bacteria, UA: NONE SEEN
Bilirubin Urine: NEGATIVE
Glucose, UA: 150 mg/dL — AB
Hgb urine dipstick: NEGATIVE
Ketones, ur: 20 mg/dL — AB
Leukocytes,Ua: NEGATIVE
Nitrite: NEGATIVE
Protein, ur: NEGATIVE mg/dL
Specific Gravity, Urine: 1.014 (ref 1.005–1.030)
pH: 7 (ref 5.0–8.0)

## 2020-02-14 LAB — CBC
HCT: 39.3 % (ref 36.0–46.0)
Hemoglobin: 13.3 g/dL (ref 12.0–15.0)
MCH: 30.2 pg (ref 26.0–34.0)
MCHC: 33.8 g/dL (ref 30.0–36.0)
MCV: 89.1 fL (ref 80.0–100.0)
Platelets: 323 10*3/uL (ref 150–400)
RBC: 4.41 MIL/uL (ref 3.87–5.11)
RDW: 12.4 % (ref 11.5–15.5)
WBC: 15.9 10*3/uL — ABNORMAL HIGH (ref 4.0–10.5)
nRBC: 0 % (ref 0.0–0.2)

## 2020-02-14 MED ORDER — LORAZEPAM 0.5 MG PO TABS: 0.5000 mg | ORAL_TABLET | Freq: Every evening | ORAL | Status: DC | PRN

## 2020-02-14 MED ORDER — IBUPROFEN 600 MG PO TABS
600.0000 mg | ORAL_TABLET | Freq: Four times a day (QID) | ORAL | Status: DC | PRN
  Administered 2020-02-14 – 2020-02-15 (×3): 600 mg via ORAL
  Filled 2020-02-14 (×3): qty 1

## 2020-02-14 MED ORDER — ENOXAPARIN SODIUM 40 MG/0.4ML ~~LOC~~ SOLN
40.0000 mg | SUBCUTANEOUS | Status: DC
  Administered 2020-02-14 – 2020-02-15 (×2): 40 mg via SUBCUTANEOUS
  Filled 2020-02-14 (×2): qty 0.4

## 2020-02-14 MED ORDER — KCL IN DEXTROSE-NACL 20-5-0.45 MEQ/L-%-% IV SOLN: INTRAVENOUS | Status: DC

## 2020-02-14 MED ORDER — POTASSIUM CHLORIDE CRYS ER 20 MEQ PO TBCR
40.0000 meq | EXTENDED_RELEASE_TABLET | Freq: Two times a day (BID) | ORAL | Status: AC
  Administered 2020-02-14 (×2): 40 meq via ORAL
  Filled 2020-02-14 (×2): qty 2

## 2020-02-14 MED ORDER — PROMETHAZINE HCL 25 MG/ML IJ SOLN
6.2500 mg | Freq: Four times a day (QID) | INTRAMUSCULAR | Status: DC | PRN
  Administered 2020-02-14 – 2020-02-15 (×3): 6.25 mg via INTRAVENOUS
  Filled 2020-02-14 (×3): qty 1

## 2020-02-14 NOTE — Progress Notes (Signed)
Continues to need pain and nausea medication. Able to keep a small amount of fluids down. Sister in law to spend night with Dr. Constance Haw permission.

## 2020-02-14 NOTE — Progress Notes (Signed)
[facility] Progress Note  1 Day Post-Op  Subjective: Nausea overnight. Says she has not slept in 4  Days and takes some lorazepam at night sometimes. She is walking and tolerating some soft foods. She says she is still hurting the Right side/ slightly lower than the ports.   Objective: Vital signs in last 24 hours: Temp:  [97.9 F (36.6 C)-98.7 F (37.1 C)] 98.2 F (36.8 C) (07/03 0820) Pulse Rate:  [56-82] 61 (07/03 0820) Resp:  [16-23] 17 (07/03 0357) BP: (140-178)/(64-121) 165/71 (07/03 0820) SpO2:  [92 %-98 %] 97 % (07/03 0820) Last BM Date: 02/11/20  Intake/Output from previous day: 07/02 0701 - 07/03 0700 In: 1492.8 [P.O.:240; I.V.:1152.8; IV Piggyback:100] Out: 5 [Blood:5] Intake/Output this shift: No intake/output data recorded.  General appearance: alert, cooperative and no distress Resp: normal work of breathing GI: soft, nondistended, tender right sided, port sites c/d/i with dermabond, some bruising, no drainage or erythema   Lab Results:  Recent Labs    02/13/20 0847 02/14/20 0619  WBC 10.1 15.9*  HGB 13.5 13.3  HCT 41.2 39.3  PLT 337 323   BMET Recent Labs    02/13/20 0847 02/14/20 0619  NA 139 136  K 3.1* 2.8*  CL 104 100  CO2 22 22  GLUCOSE 128* 116*  BUN 14 14  CREATININE 0.68 0.69  CALCIUM 9.4 9.2   PT/INR No results for input(s): LABPROT, INR in the last 72 hours.  Studies/Results: DG Chest Port 1 View  Result Date: 02/12/2020 CLINICAL DATA:  Preop evaluation for upcoming cholecystectomy EXAM: PORTABLE CHEST 1 VIEW COMPARISON:  None. FINDINGS: The heart size and mediastinal contours are within normal limits. Both lungs are clear. The visualized skeletal structures are unremarkable. IMPRESSION: No active disease. Electronically Signed   By: Inez Catalina M.D.   On: 02/12/2020 19:47    Anti-infectives: Anti-infectives (From admission, onward)   Start     Dose/Rate Route Frequency Ordered Stop   02/14/20 0600   cefoTEtan (CEFOTAN) 2 g in sodium chloride 0.9 % 100 mL IVPB        2 g 200 mL/hr over 30 Minutes Intravenous On call to O.R. 02/13/20 1209 02/13/20 1358      Assessment/Plan: Patient POD 1 s/p Laparoscopic cholecystectomy. She is having nausea but is tolerating some liquids and soft food. She is ambulating. Her K was also low today and needs replaced. She has a leukocytosis that is likely reactive. She is worried something else is going on and that it is not her gallbladder. She had a CT OSH that demonstrated no other acute process and no appendix and she thinks her appendix was removed with her prior GYN surgeries.   PRN for pain, added ibuprofen for possible muscle pain Ativan at night for sleep PRN 0.5mg , she takes this at home  IS, OOB HD ok Soft diet UA /Urine culture K replaced and IVF changed to D51/2NS + 20K  Labs in AM SCDs, lovenox    LOS: 1 day    Virl Cagey 02/14/2020

## 2020-02-14 NOTE — Progress Notes (Signed)
Has had some jello and broth and has not vomited.  Received phenergan.  Walked in hallway several times today.  Says lower right abdomen is "sore".  Sister in law here now.

## 2020-02-15 DIAGNOSIS — E871 Hypo-osmolality and hyponatremia: Secondary | ICD-10-CM | POA: Diagnosis present

## 2020-02-15 DIAGNOSIS — E876 Hypokalemia: Secondary | ICD-10-CM | POA: Diagnosis present

## 2020-02-15 DIAGNOSIS — R030 Elevated blood-pressure reading, without diagnosis of hypertension: Secondary | ICD-10-CM | POA: Diagnosis present

## 2020-02-15 LAB — COMPREHENSIVE METABOLIC PANEL
ALT: 43 U/L (ref 0–44)
AST: 41 U/L (ref 15–41)
Albumin: 4.2 g/dL (ref 3.5–5.0)
Alkaline Phosphatase: 91 U/L (ref 38–126)
Anion gap: 12 (ref 5–15)
BUN: 7 mg/dL — ABNORMAL LOW (ref 8–23)
CO2: 21 mmol/L — ABNORMAL LOW (ref 22–32)
Calcium: 9 mg/dL (ref 8.9–10.3)
Chloride: 94 mmol/L — ABNORMAL LOW (ref 98–111)
Creatinine, Ser: 0.53 mg/dL (ref 0.44–1.00)
GFR calc Af Amer: >60 mL/min (ref >60)
GFR calc non Af Amer: >60 mL/min (ref >60)
Glucose, Bld: 156 mg/dL — ABNORMAL HIGH (ref 70–99)
Potassium: 3.2 mmol/L — ABNORMAL LOW (ref 3.5–5.1)
Sodium: 127 mmol/L — ABNORMAL LOW (ref 135–145)
Total Bilirubin: 1.3 mg/dL — ABNORMAL HIGH (ref 0.3–1.2)
Total Protein: 7 g/dL (ref 6.5–8.1)

## 2020-02-15 LAB — CBC WITH DIFFERENTIAL/PLATELET
Abs Immature Granulocytes: 0.05 10*3/uL (ref 0.00–0.07)
Basophils Absolute: 0 10*3/uL (ref 0.0–0.1)
Basophils Relative: 0 %
Eosinophils Absolute: 0 10*3/uL (ref 0.0–0.5)
Eosinophils Relative: 0 %
HCT: 42.8 % (ref 36.0–46.0)
Hemoglobin: 14.5 g/dL (ref 12.0–15.0)
Immature Granulocytes: 0 %
Lymphocytes Relative: 21 %
Lymphs Abs: 2.7 10*3/uL (ref 0.7–4.0)
MCH: 30.2 pg (ref 26.0–34.0)
MCHC: 33.9 g/dL (ref 30.0–36.0)
MCV: 89.2 fL (ref 80.0–100.0)
Monocytes Absolute: 1.3 10*3/uL — ABNORMAL HIGH (ref 0.1–1.0)
Monocytes Relative: 10 %
Neutro Abs: 8.9 10*3/uL — ABNORMAL HIGH (ref 1.7–7.7)
Neutrophils Relative %: 69 %
Platelets: 339 10*3/uL (ref 150–400)
RBC: 4.8 MIL/uL (ref 3.87–5.11)
RDW: 12.4 % (ref 11.5–15.5)
WBC: 12.9 10*3/uL — ABNORMAL HIGH (ref 4.0–10.5)
nRBC: 0 % (ref 0.0–0.2)

## 2020-02-15 MED ORDER — PROCHLORPERAZINE EDISYLATE 10 MG/2ML IJ SOLN
10.0000 mg | INTRAMUSCULAR | Status: DC | PRN
  Administered 2020-02-15 (×2): 10 mg via INTRAVENOUS
  Filled 2020-02-15 (×3): qty 2

## 2020-02-15 MED ORDER — FENTANYL CITRATE (PF) 100 MCG/2ML IJ SOLN: 12.5000 ug | INTRAMUSCULAR | Status: DC | PRN

## 2020-02-15 MED ORDER — LABETALOL HCL 5 MG/ML IV SOLN: 10.0000 mg | INTRAVENOUS | Status: DC | PRN

## 2020-02-15 MED ORDER — POTASSIUM CHLORIDE IN NACL 40-0.9 MEQ/L-% IV SOLN: INTRAVENOUS | Status: DC

## 2020-02-15 MED ORDER — LABETALOL HCL 5 MG/ML IV SOLN
10.0000 mg | INTRAVENOUS | Status: DC | PRN
  Administered 2020-02-15: 10 mg via INTRAVENOUS
  Filled 2020-02-15: qty 4

## 2020-02-15 MED ORDER — SENNOSIDES-DOCUSATE SODIUM 8.6-50 MG PO TABS
2.0000 | ORAL_TABLET | Freq: Two times a day (BID) | ORAL | Status: DC
  Administered 2020-02-15 (×2): 2 via ORAL
  Filled 2020-02-15 (×3): qty 2

## 2020-02-15 NOTE — Progress Notes (Signed)
Pt sleeping at 2300 02/14/20, upon awakening she c/o nausea. Administered Zofran with minimal relief. Administered phenergan and per sister-in-law, she seemed to become more restless, legs moving around in bed and states that this occurred earlier during day shift after phenergan. BP also elevated, pain medication was administered within 48mins prior and pt stated she was not in pain, "just sick" administered prn lopressor and will notify Dr. Constance Haw on possibly trying another nausea med as phenergan makes her restless. No vomiting but pt has had dry heaves throughout night.

## 2020-02-15 NOTE — Consult Note (Signed)
Patient Demographics:    Olivia Haas, is a 86 y.o. female  MRN: [id number]   DOB - 1933/02/07  Admit Date - 02/12/2020  Outpatient Primary MD for the patient is Neale Burly, MD   Assessment & Plan:    Principal Problem:   Symptomatic cholelithiasis--status post lap chole 02/13/20 Active Problems:   Intractable nausea and vomiting   Elevated BP without diagnosis of hypertension   Biliary colic   Hyponatremia   Hypokalemia   A/p 1)Elevated BP without prior diagnosis of HTN--- patient with postop pain, persistent nausea and dry heaving -No prior history of HTN--actually per patient and family members prior to admission patient's blood pressure was usually soft and never required any antihypertensives -May use IV labetalol as needed for elevated BP -Anticipate improvement in BP with improvement in postop issues  2)Hyponatremia/hypokalemia--due to persistent nausea and dry heaving and poor oral intake -Start iv normal saline with potassium supplementation -Encourage increase oral intake as tolerated  3)Persistent nausea and dry heaving in the postop patient--prior to lap chole patient had nausea and vomiting--- symptoms persist. -Did not do well with Phenergan, Zofran was ineffective, appears to be doing better with Compazine -No fevers, , UA is not suggestive of UTI -Urine culture pending -WBC is down to 12.9 from 15.9--it may be reactive -CT abdomen and pelvis done at [facility] on 02/11/2020 without acute findings specifically appendix was not  Identified -Abdominal ultrasound on 02/12/2020 without acute findings -Further postop management per general surgeon -Patient is starting to tolerate oral intake, passing gas, ambulating in hallways  --- Findings and plan of care discussed with patient's  daughters x 2 at bedside, also discussed with attending physician Dr. Constance Haw  --Thank you Dr. Constance Haw for this medical consultation hospitalist service will follow patient concurrently with you  Disposition/Need for in-Hospital Stay- patient unable to be discharged at this time due to -persistent nausea and vomiting with electrolyte abnormalities requiring IV fluids  Status is: Inpatient  Remains inpatient appropriate because:persistent nausea and vomiting with electrolyte abnormalities requiring IV fluids   Dispo: The patient is from: Home              Anticipated d/c is to: Home              Anticipated d/c date is: 1 day              Patient currently is not medically stable to d/c. Barriers: Not Clinically Stable- -persistent nausea and vomiting with electrolyte abnormalities requiring IV fluids   With History of - Reviewed by me  Past Medical History:  Diagnosis Date  . High cholesterol   . Thyroid disease       Past Surgical History:  Procedure Laterality Date  . ABDOMINAL HYSTERECTOMY    . ANKLE SURGERY Right 2004  . TONSILLECTOMY        Chief Complaint  Patient presents with  . Emesis  HPI:    Olivia Haas  is a 86 y.o. female otherwise healthy female admitted with nausea vomiting on 02/12/2020 and found to have cholelithiasis with concern for biliary colic -Underwent lap chole on 02/13/2020--- postop patient is regarding persistent nausea and dry heaves with elevated BP now developing hyponatremia and hypokalemia  -No fevers, WBC trending down, UA not suggestive of UTI, admission chest x-ray without acute findings -CT abdomen and pelvis from [facility] dated 02/11/2019 were reviewed, abdominal ultrasound from 02/12/2020 reviewed, -Additional history obtained from patient's 2 daughters at bedside  -Also obtain additional history from patient's RN Mardene Celeste and patient's attending physician Dr. Curlene Labrum  -As per patient and family members no prior  history of hypertension actually prior to admission patient had a tendency for high BP to run low  -Patient denies chest pains, palpitations shortness of breath dizziness -Patient denies headache blurry vision for ocal weakness or numbness  -Patient has been ambulating in the hallways with family members -She has been voiding and passing gas -Nausea persist Zofran is not effective she did not tolerate Phenergan,  Compazine seems to be helpful    Review of systems:    In addition to the HPI above,   A full Review of  Systems was done, all other systems reviewed are negative except as noted above in HPI , .    Social History:  Reviewed by me    Social History   Tobacco Use  . Smoking status: Never Smoker  . Smokeless tobacco: Never Used  Substance Use Topics  . Alcohol use: No       Family History :  Reviewed by me    Family History  Problem Relation Age of Onset  . Heart attack Father   . Anesthesia problems Daughter        Sensitive to medication/ waking up     Home Medications:   Prior to Admission medications   Medication Sig Start Date End Date Taking? Authorizing Provider  atorvastatin (LIPITOR) 20 MG tablet Take 20 mg by mouth every evening.  11/18/19  Yes [provider]  HYDROcodone-acetaminophen (NORCO) 5-325 MG tablet Take 1 tablet by mouth every 6 (six) hours as needed for severe pain. 02/12/20  Yes Sherwood Gambler, MD  levothyroxine (SYNTHROID) 100 MCG tablet Take 100 mcg by mouth every morning. 11/20/19  Yes [provider]  ondansetron (ZOFRAN ODT) 4 MG disintegrating tablet Take 1 tablet (4 mg total) by mouth every 8 (eight) hours as needed for nausea or vomiting. 02/12/20  Yes Sherwood Gambler, MD     Allergies:     Allergies  Allergen Reactions  . Cucumber Extract Other (See Comments)    Vomiting  . Strawberry (Diagnostic) Other (See Comments)    Vomiting      Physical Exam:   Vitals  Blood pressure (!) 174/86, pulse 74,  temperature 98.3 F (36.8 C), temperature source Oral, resp. rate 20, height 5' (1.524 m), weight 53.3 kg, SpO2 97 %.  Physical Examination: General appearance - alert, well appearing, and in no distress  Mental status - alert, oriented to person, place, and time,  Eyes - sclera anicteric Neck - supple, no JVD elevation , Chest - clear  to auscultation bilaterally, symmetrical air movement,  Heart - S1 and S2 normal, regular  Abdomen - soft,  nondistended, appropriate postop tenderness on the right, postop wound clean dry and intact/hemostatic Neurological - screening mental status exam normal, neck supple without rigidity, cranial nerves II through XII  intact, DTR's normal and symmetric Extremities - no pedal edema noted, intact peripheral pulses  Skin - warm, dry     Data Review:    CBC Recent Labs  Lab 02/12/20 0947 02/13/20 0847 02/14/20 0619 02/15/20 0531  WBC 7.1 10.1 15.9* 12.9*  HGB 15.8* 13.5 13.3 14.5  HCT 47.2* 41.2 39.3 42.8  PLT 317 337 323 339  MCV 89.7 90.4 89.1 89.2  MCH 30.0 29.6 30.2 30.2  MCHC 33.5 32.8 33.8 33.9  RDW 12.6 12.7 12.4 12.4  LYMPHSABS 1.2 1.6  --  2.7  MONOABS 0.3 0.7  --  1.3*  EOSABS 0.0 0.0  --  0.0  BASOSABS 0.0 0.0  --  0.0   ------------------------------------------------------------------------------------------------------------------  Chemistries  Recent Labs  Lab 02/12/20 0800 02/13/20 0847 02/14/20 0619 02/15/20 0531  NA 137 139 136 127*  K 3.8 3.1* 2.8* 3.2*  CL 100 104 100 94*  CO2 23 22 22  21*  GLUCOSE 126* 128* 116* 156*  BUN 9 14 14  7*  CREATININE 0.65 0.68 0.69 0.53  CALCIUM 9.9 9.4 9.2 9.0  AST 23 21  --  41  ALT 21 20  --  43  ALKPHOS 110 93  --  91  BILITOT 0.9 1.1  --  1.3*   ------------------------------------------------------------------------------------------------------------------ estimated creatinine clearance is 37.6 mL/min (by C-G formula based on SCr of 0.53  mg/dL). ------------------------------------------------------------------------------------------------------------------ No results for input(s): TSH, T4TOTAL, T3FREE, THYROIDAB in the last 72 hours.  Invalid input(s): FREET3   Coagulation profile No results for input(s): INR, PROTIME in the last 168 hours. ------------------------------------------------------------------------------------------------------------------- No results for input(s): DDIMER in the last 72 hours. -------------------------------------------------------------------------------------------------------------------  Cardiac Enzymes No results for input(s): CKMB, TROPONINI, MYOGLOBIN in the last 168 hours.  Invalid input(s): CK ------------------------------------------------------------------------------------------------------------------ No results found for: BNP   ---------------------------------------------------------------------------------------------------------------  Urinalysis    Component Value Date/Time   COLORURINE YELLOW 02/14/2020 1309   APPEARANCEUR CLEAR 02/14/2020 1309   LABSPEC 1.014 02/14/2020 1309   PHURINE 7.0 02/14/2020 1309   GLUCOSEU 150 (A) 02/14/2020 1309   HGBUR NEGATIVE 02/14/2020 1309   BILIRUBINUR NEGATIVE 02/14/2020 1309   KETONESUR 20 (A) 02/14/2020 1309   PROTEINUR NEGATIVE 02/14/2020 1309   NITRITE NEGATIVE 02/14/2020 1309   LEUKOCYTESUR NEGATIVE 02/14/2020 1309    ----------------------------------------------------------------------------------------------------------------   Imaging Results:    No results found.  Radiological Exams on Admission: No results found.  DVT Prophylaxis -SCD  AM Labs Ordered, also please review Full Orders  Family Communication: Admission, patients condition and plan of care including tests being ordered have been discussed with the patient and daughter at bedside who indicate understanding and agree with the plan   Code  Status - Full Code  Likely DC to when nausea improves and patient is able to tolerate oral intake and nausea requires IV fluids  Condition   stable  Roxan Hockey M.D on 02/15/2020 at 5:15 PM Go to [email or web address] -  for contact info  [facility] - Office  [phone]

## 2020-02-15 NOTE — Progress Notes (Signed)
[facility] Progress Note  2 Days Post-Op  Subjective: Patient with nausea overnight. Did not tolerating phenergan so switched to compazine. Has been tolerating some diet. UA yesterday negative. Reports pain is better. Family is worried she has taken a turn for the worse, but overall mother is improving just things are going slowly.  Asked Hospitalist to weigh in to ensure I am not missing anything as the patient became hypertensive last night with dry heaves.  Dr. Denton Brick has seen and agrees looks like post operative nausea and some hypokalemia / hyponatremia and this is being corrected.   Daughters are anxious. Request yesterday to switch visitor and request to switch back today as the daughter who was away is returning today.   Objective: Vital signs in last 24 hours: Temp:  [97.9 F (36.6 C)-99.8 F (37.7 C)] 99.8 F (37.7 C) (07/04 0653) Pulse Rate:  [67-78] 67 (07/04 0653) Resp:  [15-20] 15 (07/04 0653) BP: (146-216)/(69-96) 146/82 (07/04 0653) SpO2:  [94 %-99 %] 94 % (07/04 0653) Last BM Date: 02/11/20  Intake/Output from previous day: 07/03 0701 - 07/04 0700 In: 844.9 [P.O.:50; I.V.:794.9] Out: 400 [Urine:400] Intake/Output this shift: No intake/output data recorded.  General appearance: alert, cooperative and no distress Resp: normal work of breathing GI: soft, less tender on right side, port sites c/d/i with no erythema some bruising  Lab Results:  Recent Labs    02/14/20 0619 02/15/20 0531  WBC 15.9* 12.9*  HGB 13.3 14.5  HCT 39.3 42.8  PLT 323 339   BMET Recent Labs    02/14/20 0619 02/15/20 0531  NA 136 127*  K 2.8* 3.2*  CL 100 94*  CO2 22 21*  GLUCOSE 116* 156*  BUN 14 7*  CREATININE 0.69 0.53  CALCIUM 9.2 9.0    Anti-infectives: Anti-infectives (From admission, onward)   Start     Dose/Rate Route Frequency Ordered Stop   02/14/20 0600  cefoTEtan (CEFOTAN) 2 g in sodium chloride 0.9 % 100 mL IVPB        2 g 200 mL/hr  over 30 Minutes Intravenous On call to O.R. 02/13/20 1209 02/13/20 1358      Assessment/Plan: Ms. Laiche is an 86 yo POD 2 s/p Lap cholecystectomy for symptomatic cholelithiasis and intractable nausea and vomiting. She has continued to have nausea post op but this is improving.  -PRN For pain -Ativan at bedtime if needed, home medication  -IS, OOB and walking -Diet as tolerated -Zofran and compazine for refractory nausea  -Leukocytosis down and no shift, this looks like it is all post op inflammatory driven and no signs of infection, UA was negative H&H without signs of bleeding -K improved but Na low with D51/2 so Dr. Denton Brick switched to NS +40Kcl, appreciate assistance  -LFTs reassuring -SCDs. lovenox -Home in next few days hopefully -Appreciate hospitalist assistance  -Discussed request to switch back to second visitor being the daughter, and this will be the final switch back. Discussed with Octavia Bruckner [facility] and he will ensure that they are able to switch back this time.    LOS: 2 days    Virl Cagey 02/15/2020

## 2020-02-15 NOTE — Progress Notes (Signed)
Daughter asked this nurse if visitors could be switched due to "my mother taking a turn for the worse" back to the original visitors. This nurse spoke with Toy Baker, RN [facility] and daughter was told visitors would not be switched again.

## 2020-02-16 DIAGNOSIS — B029 Zoster without complications: Secondary | ICD-10-CM | POA: Clinically undetermined

## 2020-02-16 LAB — CBC
HCT: 39.4 % (ref 36.0–46.0)
Hemoglobin: 13.2 g/dL (ref 12.0–15.0)
MCH: 29.8 pg (ref 26.0–34.0)
MCHC: 33.5 g/dL (ref 30.0–36.0)
MCV: 88.9 fL (ref 80.0–100.0)
Platelets: 274 10*3/uL (ref 150–400)
RBC: 4.43 MIL/uL (ref 3.87–5.11)
RDW: 12.3 % (ref 11.5–15.5)
WBC: 9.6 10*3/uL (ref 4.0–10.5)
nRBC: 0 % (ref 0.0–0.2)

## 2020-02-16 LAB — BASIC METABOLIC PANEL
Anion gap: 10 (ref 5–15)
BUN: 7 mg/dL — ABNORMAL LOW (ref 8–23)
CO2: 19 mmol/L — ABNORMAL LOW (ref 22–32)
Calcium: 8.6 mg/dL — ABNORMAL LOW (ref 8.9–10.3)
Chloride: 102 mmol/L (ref 98–111)
Creatinine, Ser: 0.51 mg/dL (ref 0.44–1.00)
GFR calc Af Amer: >60 mL/min (ref >60)
GFR calc non Af Amer: >60 mL/min (ref >60)
Glucose, Bld: 113 mg/dL — ABNORMAL HIGH (ref 70–99)
Potassium: 3.6 mmol/L (ref 3.5–5.1)
Sodium: 131 mmol/L — ABNORMAL LOW (ref 135–145)

## 2020-02-16 LAB — URINE CULTURE: Culture: NO GROWTH

## 2020-02-16 MED ORDER — PROCHLORPERAZINE MALEATE 10 MG PO TABS: 10.0000 mg | ORAL_TABLET | Freq: Four times a day (QID) | ORAL | 0 refills | Status: DC | PRN

## 2020-02-16 MED ORDER — VALACYCLOVIR HCL 1 G PO TABS: 1000.0000 mg | ORAL_TABLET | Freq: Three times a day (TID) | ORAL | 0 refills | Status: AC

## 2020-02-16 MED ORDER — AMLODIPINE BESYLATE 2.5 MG PO TABS: 2.5000 mg | ORAL_TABLET | Freq: Every day | ORAL | 0 refills | Status: DC

## 2020-02-16 MED ORDER — ACETAMINOPHEN 325 MG PO TABS: 650.0000 mg | ORAL_TABLET | Freq: Four times a day (QID) | ORAL | 0 refills | Status: AC | PRN

## 2020-02-16 MED ORDER — HYDROCODONE-ACETAMINOPHEN 5-325 MG PO TABS: 1.0000 | ORAL_TABLET | Freq: Four times a day (QID) | ORAL | 0 refills | Status: DC | PRN

## 2020-02-16 MED ORDER — AMLODIPINE BESYLATE 5 MG PO TABS
2.5000 mg | ORAL_TABLET | Freq: Every day | ORAL | Status: DC
  Administered 2020-02-16: 2.5 mg via ORAL
  Filled 2020-02-16: qty 1

## 2020-02-16 NOTE — Discharge Summary (Signed)
Physician Discharge Summary  Patient ID: Olivia Haas MRN: [id number] DOB/AGE: 1932/09/16 86 y.o.  Admit date: 02/12/2020 Discharge date: 02/16/2020  Admission Diagnoses: Biliary colic/ intractable nausea and vomiting   Discharge Diagnoses:  Principal Problem:   Symptomatic cholelithiasis--status post lap chole 02/13/20 Active Problems:   Intractable nausea and vomiting   Biliary colic   Elevated BP without diagnosis of hypertension   Hyponatremia   Hypokalemia   Shingles rash- Rt Flank   Discharged Condition: good  Hospital Course: Olivia Haas is a 86 yo with RUQ pain and nausea and vomiting that had been going on for about 1 week prior to discharge. Workup had been negative except for cholelithiasis and it was felt that she had biliary colic and intractable nausea and vomiting.  She was admitted and underwent an uneventful laparoscopic cholecystectomy.  Post operatively she had nausea and was was kept in the hospital for monitoring. She also had hypokalemia that was treated.  During her hospital stay with the nausea and dry heaves she did develop some hypertension and was started on a low dose amlodipine.  I had the hospitalist evaluated her given her hypertension and nausea and continued pain to ensure we were not missing anything.  On POD 3, she developed a dermatomal rash consistent with shingles which had not been present. It is unclear but likely that this developed from the stress of the biliary colic and surgery.  She has had resolved nausea and vomiting and is tolerating a diet.   She is ready to go home and has been ambulating. Her daughter is an Therapist, sports and plans to check her BP regularly to see if she still is going to need to amlodipine. They are comfortable taking her home with Valtrex for the shingles.  She is going to follow up with Dr. Dagoberto Ligas for the Shingles and Hypertension.    Consults: Hospitalist- for hypertension and nausea/ vomiting post op  Significant Diagnostic  Studies:  Results for SOLIANA, KITKO (MRN [id number]) as of 02/16/2020 12:41  Ref. Range 02/16/2020 04:44  Sodium Latest Ref Range: 135 - 145 mmol/L 131 (L)  Potassium Latest Ref Range: 3.5 - 5.1 mmol/L 3.6  Chloride Latest Ref Range: 98 - 111 mmol/L 102  CO2 Latest Ref Range: 22 - 32 mmol/L 19 (L)  Glucose Latest Ref Range: 70 - 99 mg/dL 113 (H)  BUN Latest Ref Range: 8 - 23 mg/dL 7 (L)  Creatinine Latest Ref Range: 0.44 - 1.00 mg/dL 0.51  Calcium Latest Ref Range: 8.9 - 10.3 mg/dL 8.6 (L)  Anion gap Latest Ref Range: 5 - 15  10  GFR, Est Non African American Latest Ref Range: >60 mL/min >60  GFR, Est African American Latest Ref Range: >60 mL/min >60  WBC Latest Ref Range: 4.0 - 10.5 K/uL 9.6  RBC Latest Ref Range: 3.87 - 5.11 MIL/uL 4.43  Hemoglobin Latest Ref Range: 12.0 - 15.0 g/dL 13.2  HCT Latest Ref Range: 36 - 46 % 39.4  MCV Latest Ref Range: 80.0 - 100.0 fL 88.9  MCH Latest Ref Range: 26.0 - 34.0 pg 29.8  MCHC Latest Ref Range: 30.0 - 36.0 g/dL 33.5  RDW Latest Ref Range: 11.5 - 15.5 % 12.3  Platelets Latest Ref Range: 150 - 400 K/uL 274  nRBC Latest Ref Range: 0.0 - 0.2 % 0.0   Results for ALEXEA, BLASE (MRN [id number]) as of 02/16/2020 12:41  Ref. Range 02/14/2020 13:09  Appearance Latest Ref Range: CLEAR  CLEAR  Bilirubin Urine Latest Ref Range:  NEGATIVE  NEGATIVE  Color, Urine Latest Ref Range: YELLOW  YELLOW  Glucose, UA Latest Ref Range: NEGATIVE mg/dL 150 (A)  Hgb urine dipstick Latest Ref Range: NEGATIVE  NEGATIVE  Ketones, ur Latest Ref Range: NEGATIVE mg/dL 20 (A)  Leukocytes,Ua Latest Ref Range: NEGATIVE  NEGATIVE  Nitrite Latest Ref Range: NEGATIVE  NEGATIVE  pH Latest Ref Range: 5.0 - 8.0  7.0  Protein Latest Ref Range: NEGATIVE mg/dL NEGATIVE  Specific Gravity, Urine Latest Ref Range: 1.005 - 1.030  1.014  Bacteria, UA Latest Ref Range: NONE SEEN  NONE SEEN  Mucus Unknown PRESENT  RBC / HPF Latest Ref Range: 0 - 5 RBC/hpf 0-5  WBC, UA Latest Ref Range: 0 - 5  WBC/hpf 0-5   Treatments: IV hydration and laparoscopic cholecystectomy on 02/13/2020, shingles treatment at discharge  Discharge Exam: Blood pressure (!) 155/118, pulse 83, temperature 97.8 F (36.6 C), temperature source Oral, resp. rate 20, height 5' (1.524 m), weight 53.3 kg, SpO2 96 %. General appearance: alert, cooperative and no distress Resp: normal work of breathing GI: soft, appropriately tender, port sites with dermabond, some bruising, no erythema or drainage Skin: dermatomal rash inferior to the port sites on the right and extending to flank  Disposition: Discharge disposition: 01-Home or Self Care       Discharge Instructions    Call MD for:  difficulty breathing, headache or visual disturbances   Complete by: As directed    Call MD for:  extreme fatigue   Complete by: As directed    Call MD for:  persistant dizziness or light-headedness   Complete by: As directed    Call MD for:  persistant nausea and vomiting   Complete by: As directed    Call MD for:  severe uncontrolled pain   Complete by: As directed    Call MD for:  temperature >100.4   Complete by: As directed    Change dressing (specify)   Complete by: As directed    Dressing change/postoperative wound care as advised by general surgeon   Diet - low sodium heart healthy   Complete by: As directed    Discharge instructions   Complete by: As directed    1) Valtrex 1 g 3 times a day for 1 week as prescribed for presumed right flank shingles 2) take amlodipine 2.5 mg daily for blood pressure--- check your blood pressure daily and keep a diary/log of your blood pressure readings and take this to primary care physician in 7 to 10 days to see if your blood pressure medication needs to be adjusted   Increase activity slowly   Complete by: As directed      Allergies as of 02/16/2020      Reactions   Cucumber Extract Other (See Comments)   Vomiting   Strawberry (diagnostic) Other (See Comments)   Vomiting        Medication List    STOP taking these medications   ondansetron 4 MG disintegrating tablet Commonly known as: Zofran ODT     TAKE these medications   acetaminophen 325 MG tablet Commonly known as: TYLENOL Take 2 tablets (650 mg total) by mouth every 6 (six) hours as needed for mild pain (or temp > 100).   amLODipine 2.5 MG tablet Commonly known as: NORVASC Take 1 tablet (2.5 mg total) by mouth daily. For BP Start taking on: February 17, 2020   atorvastatin 20 MG tablet Commonly known as: LIPITOR Take 20 mg by mouth every evening.  HYDROcodone-acetaminophen 5-325 MG tablet Commonly known as: Norco Take 1 tablet by mouth every 6 (six) hours as needed for severe pain.   levothyroxine 100 MCG tablet Commonly known as: SYNTHROID Take 100 mcg by mouth every morning.   prochlorperazine 10 MG tablet Commonly known as: COMPAZINE Take 1 tablet (10 mg total) by mouth every 6 (six) hours as needed for nausea, vomiting or refractory nausea / vomiting.   valACYclovir 1000 MG tablet Commonly known as: Valtrex Take 1 tablet (1,000 mg total) by mouth 3 (three) times daily for 7 days. For Shingles            Discharge Care Instructions  (From admission, onward)         Start     Ordered   02/16/20 0000  Change dressing (specify)       Comments: Dressing change/postoperative wound care as advised by general surgeon   02/16/20 1242          Follow-up Information    Virl Cagey, MD On 03/09/2020.   Specialty: General Surgery Why: post operative phone call, you can call the office if you need to be seen in person  Contact information: [address] [place] [facility] [address] [phone]               Signed: Virl Cagey 02/16/2020, 12:50 PM

## 2020-02-16 NOTE — Discharge Instructions (Signed)
1) Valtrex 1 g 3 times a day for 1 week as prescribed for presumed right flank shingles 2) take amlodipine 2.5 mg daily for blood pressure--- check your blood pressure daily and keep a diary/log of your blood pressure readings and take this to primary care physician in 7 to 10 days to see if your blood pressure medication needs to be adjusted  Discharge Laparoscopic Surgery Instructions:  Common Complaints: Right shoulder pain is common after laparoscopic surgery. This is secondary to the gas used in the surgery being trapped under the diaphragm.  Walk to help your body absorb the gas. This will improve in a few days. Pain at the port sites are common, especially the larger port sites. This will improve with time.  Some nausea is common and poor appetite. The main goal is to stay hydrated the first few days after surgery.   Diet/ Activity: Diet as tolerated. You may not have an appetite, but it is important to stay hydrated. Drink 64 ounces of water a day. Your appetite will return with time.  Shower per your regular routine daily.  Do not take hot showers. Take warm showers that are less than 10 minutes. Rest and listen to your body, but do not remain in bed all day.  Walk everyday for at least 15-20 minutes. Deep cough and move around every 1-2 hours in the first few days after surgery.  Do not lift > 10 lbs, perform excessive bending, pushing, pulling, squatting for 1-2 weeks after surgery.  Do not pick at the dermabond glue on your incision sites.  This glue film will remain in place for 1-2 weeks and will start to peel off.  Do not place lotions or balms on your incision unless instructed to specifically by Dr. Constance Haw.   Pain Expectations and Narcotics: -After surgery you will have pain associated with your incisions and this is normal. The pain is muscular and nerve pain, and will get better with time. -You are encouraged and expected to take non narcotic medications like tylenol and  ibuprofen (when able) to treat pain as multiple modalities can aid with pain treatment. -Narcotics are only used when pain is severe or there is breakthrough pain. -You are not expected to have a pain score of 0 after surgery, as we cannot prevent pain. A pain score of 3-4 that allows you to be functional, move, walk, and tolerate some activity is the goal. The pain will continue to improve over the days after surgery and is dependent on your surgery. -Due to [place] law, we are only able to give a certain amount of pain medication to treat post operative pain, and we only give additional narcotics on a patient by patient basis.  -For most laparoscopic surgery, studies have shown that the majority of patients only need 10-15 narcotic pills, and for open surgeries most patients only need 15-20.   -Having appropriate expectations of pain and knowledge of pain management with non narcotics is important as we do not want anyone to become addicted to narcotic pain medication.  -Using ice packs in the first 48 hours and heating pads after 48 hours, wearing an abdominal binder (when recommended), and using over the counter medications are all ways to help with pain management.   -Simple acts like meditation and mindfulness practices after surgery can also help with pain control and research has proven the benefit of these practices.  Medication: Take tylenol and ibuprofen as needed for pain control, alternating every 4-6 hours.  Example:  Take Norco for breakthrough pain every 4 hours.  Take Colace for constipation related to narcotic pain medication. If you do not have a bowel movement in 2 days, take Miralax over the counter.  Drink plenty of water to also prevent constipation.   Contact Information: If you have questions or concerns, please call our office, [phone], Monday- Thursday 8AM-5PM and Friday 8AM-12Noon.  If it is after hours or on the weekend, please call [facility] Main [facility], [phone], and  ask to speak to the surgeon on call for Dr. Constance Haw at [facility].    Laparoscopic Cholecystectomy, Care After This sheet gives you information about how to care for yourself after your procedure. Your doctor may also give you more specific instructions. If you have problems or questions, contact your doctor. Follow these instructions at home: Care for cuts from surgery (incisions)   Follow instructions from your doctor about how to take care of your cuts from surgery. Make sure you: ? Wash your hands with soap and water before you change your bandage (dressing). If you cannot use soap and water, use hand sanitizer. ? Change your bandage as told by your doctor. ? Leave stitches (sutures), skin glue, or skin tape (adhesive) strips in place. They may need to stay in place for 2 weeks or longer. If tape strips get loose and curl up, you may trim the loose edges. Do not remove tape strips completely unless your doctor says it is okay.  Do not take baths, swim, or use a hot tub until your doctor says it is okay.   You may shower.  Check your surgical cut area every day for signs of infection. Check for: ? More redness, swelling, or pain. ? More fluid or blood. ? Warmth. ? Pus or a bad smell. Activity  Do not drive or use heavy machinery while taking prescription pain medicine.  Do not lift anything that is heavier than 10 lb (4.5 kg) until your doctor says it is okay.  Do not play contact sports until your doctor says it is okay.  Do not drive for 24 hours if you were given a medicine to help you relax (sedative).  Rest as needed. Do not return to work or school until your doctor says it is okay. General instructions  Take over-the-counter and prescription medicines only as told by your doctor.  To prevent or treat constipation while you are taking prescription pain medicine, your doctor may recommend that you: ? Drink enough fluid to keep your pee (urine) clear or pale  yellow. ? Take over-the-counter or prescription medicines. ? Eat foods that are high in fiber, such as fresh fruits and vegetables, whole grains, and beans. ? Limit foods that are high in fat and processed sugars, such as fried and sweet foods. Contact a doctor if:  You develop a rash.  You have more redness, swelling, or pain around your surgical cuts.  You have more fluid or blood coming from your surgical cuts.  Your surgical cuts feel warm to the touch.  You have pus or a bad smell coming from your surgical cuts.  You have a fever.  One or more of your surgical cuts breaks open. Get help right away if:  You have trouble breathing.  You have chest pain.  You have pain that is getting worse in your shoulders.  You faint or feel dizzy when you stand.  You have very bad pain in your belly (abdomen).  You are sick to  your stomach (nauseous) for more than one day.  You have throwing up (vomiting) that lasts for more than one day.  You have leg pain. This information is not intended to replace advice given to you by your health care provider. Make sure you discuss any questions you have with your health care provider. Document Revised: 07/13/2017 Document Reviewed: 01/17/2016 Elsevier Patient Education  [place].   1) Valtrex 1 g 3 times a day for 1 week as prescribed for presumed right flank shingles 2) take amlodipine 2.5 mg daily for blood pressure--- check your blood pressure daily and keep a diary/log of your blood pressure readings and take this to primary care physician in 7 to 10 days to see if your blood pressure medication needs to be adjusted

## 2020-02-16 NOTE — Progress Notes (Signed)
Patient Demographics:    Olivia Haas, is a 86 y.o. female, DOB - 01/07/1933, UDJ:[id number]  Admit date - 02/12/2020   Admitting Physician Virl Cagey, MD  Outpatient Primary MD for the patient is Neale Burly, MD  LOS - 3   Chief Complaint  Patient presents with  . Emesis        Subjective:    Olivia Haas today has no fevers, no emesis,  No chest pain,  --patient developed right flank/periumbilical area rash overnight/this morning, denies tingling numbness or significant pain in the area of the rash, however rash is in a dermatomal distribution pattern suggestive of shingles  Assessment  & Plan :    Principal Problem:   Symptomatic cholelithiasis--status post lap chole 02/13/20 Active Problems:   Intractable nausea and vomiting   Elevated BP without diagnosis of hypertension   Shingles rash- Rt Flank   Biliary colic   Hyponatremia   Hypokalemia   1)Status post lap chole for symptomatic cholelithiasis on 02/13/2020----discharge plan and instructions as per general surgeon Dr. Constance Haw -she is eating and drinking better, no fevers, ambulating, had BMs  2)Elevated BP without prior diagnosis of HTN--- discharge on amlodipine 2.5 mg daily, BP recheck with PCP in 7 to 10 days as advised in discharge instructions  3)Presumed shingles--  --patient developed right flank/periumbilical area rash overnight/this morning, denies tingling numbness or significant pain in the area of the rash, however rash is in a dermatomal distribution pattern suggestive of shingles --Treat empirically with p.o. Valtrex   4)Hyponatremia/hypokalemia--due to persistent nausea and dry heaving and poor oral intake -Overall improved with IV fluids, patient is tolerating oral intake at this time anticipate further improvement with better tolerance of oral intake  5)Persistent Nausea and dry Heaving in the postop  patient--prior to lap chole patient had nausea and vomiting---  -Did not do well with Phenergan, Zofran was ineffective, appears to be doing better with Compazine -No fevers, , UA is not suggestive of UTI -Urine culture NGTD -WBC is down to 9.6 from 15.9--it may be reactive -CT abdomen and pelvis done at [facility] on 02/11/2020 without acute findings specifically appendix was not  Identified -Abdominal ultrasound on 02/12/2020 without acute findings -Further postop management per general surgeon --Nausea vomiting is resolved --she is eating and drinking better, no fevers, ambulating, had BMs   Disposition--discharge decision as per general surgeon/primary team  Code Status : full code  Family Communication:   (patient is alert, awake and coherent) Discussed with daughter at bedside  Consults  : General surgery was primary team, [facility] was consulted  DVT Prophylaxis  :  Lovenox -  Lab Results  Component Value Date   PLT 274 02/16/2020    Inpatient Medications  Scheduled Meds: . amLODipine  2.5 mg Oral Daily  . enoxaparin (LOVENOX) injection  40 mg Subcutaneous Q24H  . levothyroxine  100 mcg Oral Q0600  . pantoprazole (PROTONIX) IV  40 mg Intravenous QHS  . senna-docusate  2 tablet Oral BID   Continuous Infusions: . 0.9 % NaCl with KCl 40 mEq / L 75 mL/hr at 02/15/20 2200   PRN Meds:.acetaminophen **OR** acetaminophen, fentaNYL (SUBLIMAZE) injection, ibuprofen, labetalol, LORazepam, ondansetron **OR** ondansetron (ZOFRAN) IV, oxyCODONE, prochlorperazine, simethicone  Anti-infectives (From admission, onward)   Start     Dose/Rate Route Frequency Ordered Stop   02/16/20 0000  valACYclovir (VALTREX) 1000 MG tablet     Discontinue     1,000 mg Oral 3 times daily 02/16/20 1242 02/23/20 2359   02/14/20 0600  cefoTEtan (CEFOTAN) 2 g in sodium chloride 0.9 % 100 mL IVPB        2 g 200 mL/hr over 30 Minutes Intravenous On call to O.R. 02/13/20 1209 02/13/20 1358          Objective:   Vitals:   02/16/20 0558 02/16/20 0847 02/16/20 0926 02/16/20 1100  BP: (!) 168/91 (!) 159/100  (!) 155/118  Pulse: 82   83  Resp: 20     Temp: 97.8 F (36.6 C)     TempSrc: Oral     SpO2: 95%  96%   Weight:      Height:        Wt Readings from Last 3 Encounters:  02/12/20 53.3 kg  02/12/20 54.9 kg  12/04/19 56.2 kg     Intake/Output Summary (Last 24 hours) at 02/16/2020 1244 Last data filed at 02/15/2020 1500 Gross per 24 hour  Intake 422.93 ml  Output --  Net 422.93 ml     Physical Exam  Gen:- Awake Alert,  In no apparent distress  HEENT:- [place].AT, No sclera icterus Neck-Supple Neck,No JVD,.  Lungs-  CTAB , fair symmetrical air movement CV- S1, S2 normal, regular  Abd-  +ve B.Sounds, Abd Soft, appropriate postop tenderness, wounds are dry and hemostatic  extremity :- No  edema, pedal pulses present  Psych-affect is appropriate, oriented x3 Neuro-no new focal deficits, no tremors Skin--  right flank/periumbilical area rash erythematous with vesicular presentation suggestive of shingles--spread in a dermatomal fashion   Data Review:   Micro Results Recent Results (from the past 240 hour(s))  SARS Coronavirus 2 by RT PCR (hospital order, performed in [facility] hospital lab) Nasopharyngeal Nasopharyngeal Swab     Status: None   Collection Time: 02/12/20  8:22 PM   Specimen: Nasopharyngeal Swab  Result Value Ref Range Status   SARS Coronavirus 2 NEGATIVE NEGATIVE Final    Comment: (NOTE) SARS-CoV-2 target nucleic acids are NOT DETECTED.  The SARS-CoV-2 RNA is generally detectable in upper and lower respiratory specimens during the acute phase of infection. The lowest concentration of SARS-CoV-2 viral copies this assay can detect is 250 copies / mL. A negative result does not preclude SARS-CoV-2 infection and should not be used as the sole basis for treatment or other patient management decisions.  A negative result may occur with improper  specimen collection / handling, submission of specimen other than nasopharyngeal swab, presence of viral mutation(s) within the areas targeted by this assay, and inadequate number of viral copies (<250 copies / mL). A negative result must be combined with clinical observations, patient history, and epidemiological information.  Fact Sheet for Patients:   StrictlyIdeas.no  Fact Sheet for Healthcare Providers: BankingDealers.co.za  This test is not yet approved or  cleared by the [place] FDA and has been authorized for detection and/or diagnosis of SARS-CoV-2 by FDA under an Emergency Use Authorization (EUA).  This EUA will remain in effect (meaning this test can be used) for the duration of the COVID-19 declaration under Section 564(b)(1) of the Act, 21 U.S.C. section 360bbb-3(b)(1), unless the authorization is terminated or revoked sooner.  Performed at [facility], [address] [place], [place] [address]   Surgical  pcr screen     Status: None   Collection Time: 02/12/20 11:46 PM   Specimen: Nasal Mucosa; Nasal Swab  Result Value Ref Range Status   MRSA, PCR NEGATIVE NEGATIVE Final   Staphylococcus aureus NEGATIVE NEGATIVE Final    Comment: (NOTE) The Xpert SA Assay (FDA approved for NASAL specimens in patients 20 years of age and older), is one component of a comprehensive surveillance program. It is not intended to diagnose infection nor to guide or monitor treatment. Performed at [facility], [address], [place], [place] [address]   Urine Culture     Status: None   Collection Time: 02/14/20  1:09 PM   Specimen: Urine, Clean Catch  Result Value Ref Range Status   Specimen Description   Final    URINE, CLEAN CATCH Performed at [facility], [address] [place], [place] [address]    Special Requests   Final    NONE Performed at [facility], [address], [place], [place] [address]    Culture   Final    NO  GROWTH Performed at [facility], [place] [address], [place], [place] [address]    Report Status 02/16/2020 FINAL  Final    Radiology Reports DG Chest Port 1 View  Result Date: 02/12/2020 CLINICAL DATA:  Preop evaluation for upcoming cholecystectomy EXAM: PORTABLE CHEST 1 VIEW COMPARISON:  None. FINDINGS: The heart size and mediastinal contours are within normal limits. Both lungs are clear. The visualized skeletal structures are unremarkable. IMPRESSION: No active disease. Electronically Signed   By: Inez Catalina M.D.   On: 02/12/2020 19:47   US Abdomen Limited RUQ  Result Date: 02/12/2020 CLINICAL DATA:  Right upper quadrant abdominal pain with nausea and vomiting EXAM: ULTRASOUND ABDOMEN LIMITED RIGHT UPPER QUADRANT COMPARISON:  02/11/2020 abdominal CT FINDINGS: Gallbladder: Multiple gallstones.  No wall thickening or focal tenderness. Common bile duct: Diameter: 2 mm Liver: No focal lesion identified. Within normal limits in parenchymal echogenicity. Portal vein is patent on color Doppler imaging with normal direction of blood flow towards the liver. IMPRESSION: Cholelithiasis without findings of cholecystitis. Electronically Signed   By: Monte Fantasia M.D.   On: 02/12/2020 08:44     CBC Recent Labs  Lab 02/12/20 0947 02/13/20 0847 02/14/20 0619 02/15/20 0531 02/16/20 0444  WBC 7.1 10.1 15.9* 12.9* 9.6  HGB 15.8* 13.5 13.3 14.5 13.2  HCT 47.2* 41.2 39.3 42.8 39.4  PLT 317 337 323 339 274  MCV 89.7 90.4 89.1 89.2 88.9  MCH 30.0 29.6 30.2 30.2 29.8  MCHC 33.5 32.8 33.8 33.9 33.5  RDW 12.6 12.7 12.4 12.4 12.3  LYMPHSABS 1.2 1.6  --  2.7  --   MONOABS 0.3 0.7  --  1.3*  --   EOSABS 0.0 0.0  --  0.0  --   BASOSABS 0.0 0.0  --  0.0  --     Chemistries  Recent Labs  Lab 02/12/20 0800 02/13/20 0847 02/14/20 0619 02/15/20 0531 02/16/20 0444  NA 137 139 136 127* 131*  K 3.8 3.1* 2.8* 3.2* 3.6  CL 100 104 100 94* 102  CO2 23 22 22  21* 19*  GLUCOSE 126* 128* 116* 156*  113*  BUN 9 14 14  7* 7*  CREATININE 0.65 0.68 0.69 0.53 0.51  CALCIUM 9.9 9.4 9.2 9.0 8.6*  AST 23 21  --  41  --   ALT 21 20  --  43  --   ALKPHOS 110 93  --  91  --   BILITOT 0.9 1.1  --  1.3*  --    ------------------------------------------------------------------------------------------------------------------ No results for input(s): CHOL, HDL, LDLCALC, TRIG, CHOLHDL, LDLDIRECT in the last 72 hours.  No results found for: HGBA1C ------------------------------------------------------------------------------------------------------------------ No results for input(s): TSH, T4TOTAL, T3FREE, THYROIDAB in the last 72 hours.  Invalid input(s): FREET3 ------------------------------------------------------------------------------------------------------------------ No results for input(s): VITAMINB12, FOLATE, FERRITIN, TIBC, IRON, RETICCTPCT in the last 72 hours.  Coagulation profile No results for input(s): INR, PROTIME in the last 168 hours.  No results for input(s): DDIMER in the last 72 hours.  Cardiac Enzymes No results for input(s): CKMB, TROPONINI, MYOGLOBIN in the last 168 hours.  Invalid input(s): CK ------------------------------------------------------------------------------------------------------------------ No results found for: BNP   Roxan Hockey M.D on 02/16/2020 at 12:44 PM  Go to [email or web address] - for contact info  [facility] - Office  [phone]

## 2020-02-17 ENCOUNTER — Encounter (HOSPITAL_COMMUNITY): Payer: Self-pay | Admitting: General Surgery

## 2020-02-18 LAB — SURGICAL PATHOLOGY

## 2020-03-09 ENCOUNTER — Ambulatory Visit (INDEPENDENT_AMBULATORY_CARE_PROVIDER_SITE_OTHER): Payer: Medicare Other | Admitting: General Surgery

## 2020-03-09 DIAGNOSIS — K8020 Calculus of gallbladder without cholecystitis without obstruction: Secondary | ICD-10-CM

## 2020-03-09 NOTE — Progress Notes (Signed)
[facility]  This is a non billable encounter, post operative phone call.   I am calling the patient for post operative evaluation due to the current COVID 19 pandemic.  The patient had a laparoscopic cholecystectomy on 02/13/2020. The patient reports that they are doing well from her gallbladder surgery but she has shingles still and is now on gabapentin and seeing her primary care for this. The are tolerating a diet, having good pain control aside from the shingle pain, and having regular Bms but these are looser.  The patient has no concerns.   Will see the patient PRN.   Diet and activity tolerate.  Told her to try some fiber to see if that helps with the loose stools.    Pathology: FINAL MICROSCOPIC DIAGNOSIS:   A. GALLBLADDER, CHOLECYSTECTOMY:  - Chronic cholecystitis and cholelithiasis   Curlene Labrum, MD [facility] [address] [place], [place] [address] [phone] (office)

## 2020-03-16 DIAGNOSIS — Z0000 Encounter for general adult medical examination without abnormal findings: Secondary | ICD-10-CM | POA: Diagnosis not present

## 2020-03-16 DIAGNOSIS — E038 Other specified hypothyroidism: Secondary | ICD-10-CM | POA: Diagnosis not present

## 2020-03-16 DIAGNOSIS — F411 Generalized anxiety disorder: Secondary | ICD-10-CM | POA: Diagnosis not present

## 2020-03-16 DIAGNOSIS — Z1331 Encounter for screening for depression: Secondary | ICD-10-CM | POA: Diagnosis not present

## 2020-03-16 DIAGNOSIS — E7849 Other hyperlipidemia: Secondary | ICD-10-CM | POA: Diagnosis not present

## 2020-05-25 DIAGNOSIS — Z23 Encounter for immunization: Secondary | ICD-10-CM | POA: Diagnosis not present

## 2020-07-14 DIAGNOSIS — Z1331 Encounter for screening for depression: Secondary | ICD-10-CM | POA: Diagnosis not present

## 2020-07-14 DIAGNOSIS — Z131 Encounter for screening for diabetes mellitus: Secondary | ICD-10-CM | POA: Diagnosis not present

## 2020-07-14 DIAGNOSIS — E038 Other specified hypothyroidism: Secondary | ICD-10-CM | POA: Diagnosis not present

## 2020-07-14 DIAGNOSIS — Z0000 Encounter for general adult medical examination without abnormal findings: Secondary | ICD-10-CM | POA: Diagnosis not present

## 2020-07-14 DIAGNOSIS — E7849 Other hyperlipidemia: Secondary | ICD-10-CM | POA: Diagnosis not present

## 2020-07-14 DIAGNOSIS — F411 Generalized anxiety disorder: Secondary | ICD-10-CM | POA: Diagnosis not present

## 2020-11-15 DIAGNOSIS — E7849 Other hyperlipidemia: Secondary | ICD-10-CM | POA: Diagnosis not present

## 2020-11-15 DIAGNOSIS — E038 Other specified hypothyroidism: Secondary | ICD-10-CM | POA: Diagnosis not present

## 2020-11-15 DIAGNOSIS — F411 Generalized anxiety disorder: Secondary | ICD-10-CM | POA: Diagnosis not present

## 2020-11-15 DIAGNOSIS — L570 Actinic keratosis: Secondary | ICD-10-CM | POA: Diagnosis not present

## 2021-01-05 ENCOUNTER — Emergency Department (HOSPITAL_COMMUNITY)
Admission: EM | Admit: 2021-01-05 | Discharge: 2021-01-05 | Disposition: A | Discharge status: Home / Self Care | Payer: Medicare Other | Attending: Emergency Medicine | Admitting: Emergency Medicine

## 2021-01-05 ENCOUNTER — Encounter (HOSPITAL_COMMUNITY): Payer: Self-pay

## 2021-01-05 ENCOUNTER — Other Ambulatory Visit: Payer: Self-pay

## 2021-01-05 DIAGNOSIS — Y92009 Unspecified place in unspecified non-institutional (private) residence as the place of occurrence of the external cause: Secondary | ICD-10-CM | POA: Insufficient documentation

## 2021-01-05 DIAGNOSIS — W108XXA Fall (on) (from) other stairs and steps, initial encounter: Secondary | ICD-10-CM | POA: Insufficient documentation

## 2021-01-05 DIAGNOSIS — M549 Dorsalgia, unspecified: Secondary | ICD-10-CM | POA: Diagnosis not present

## 2021-01-05 DIAGNOSIS — W19XXXA Unspecified fall, initial encounter: Secondary | ICD-10-CM

## 2021-01-05 DIAGNOSIS — S79912A Unspecified injury of left hip, initial encounter: Secondary | ICD-10-CM | POA: Diagnosis present

## 2021-01-05 DIAGNOSIS — Z79899 Other long term (current) drug therapy: Secondary | ICD-10-CM | POA: Insufficient documentation

## 2021-01-05 DIAGNOSIS — S300XXA Contusion of lower back and pelvis, initial encounter: Secondary | ICD-10-CM

## 2021-01-05 DIAGNOSIS — S7002XA Contusion of left hip, initial encounter: Secondary | ICD-10-CM | POA: Insufficient documentation

## 2021-01-05 DIAGNOSIS — R52 Pain, unspecified: Secondary | ICD-10-CM | POA: Diagnosis not present

## 2021-01-05 NOTE — ED Provider Notes (Signed)
[facility] EMERGENCY DEPARTMENT Provider Note   CSN: [id number] Arrival date & time: 01/05/21  1558     History Chief Complaint  Patient presents with  . Back Pain    Olivia Haas is a 87 y.o. female.  Patient suffered a fall from a step stool/ladder while trying to hang a curtain rod at home. She landed on her left hip and lower back. She did not hit her head or lose consciousness. She denies dizziness or lightheadedness prior to the fall. She is complaining of mild discomfort to the left side of her lower back and hip. No obvious bruising or discoloration. Patient is ambulatory without difficulty.  The history is provided by the patient. No language interpreter was used.  Back Pain Location:  Gluteal region and lumbar spine Quality:  Aching and stiffness Radiates to:  Does not radiate Onset quality:  Sudden Duration:  4 hours Progression:  Waxing and waning Chronicity:  New Context: falling   Associated symptoms: no abdominal pain, no chest pain and no pelvic pain        Past Medical History:  Diagnosis Date  . High cholesterol   . Thyroid disease     Patient Active Problem List   Diagnosis Date Noted  . Shingles rash- Rt Flank 02/16/2020  . Elevated BP without diagnosis of hypertension 02/15/2020  . Hyponatremia 02/15/2020  . Hypokalemia 02/15/2020  . Symptomatic cholelithiasis--status post lap chole 02/13/20 02/12/2020  . Intractable nausea and vomiting 02/12/2020  . Biliary colic 15/40/0867  . History of lumbar laminectomy for spinal cord decompression 12/04/2019    Past Surgical History:  Procedure Laterality Date  . ABDOMINAL HYSTERECTOMY    . ANKLE SURGERY Right 2004  . CHOLECYSTECTOMY N/A 02/13/2020   Procedure: LAPAROSCOPIC CHOLECYSTECTOMY;  Surgeon: Virl Cagey, MD;  Location: [facility];  Service: General;  Laterality: N/A;  . TONSILLECTOMY       OB History   No obstetric history on file.     Family History  Problem Relation Age of  Onset  . Heart attack Father   . Anesthesia problems Daughter        Sensitive to medication/ waking up     Social History   Tobacco Use  . Smoking status: Never Smoker  . Smokeless tobacco: Never Used  Vaping Use  . Vaping Use: Never used  Substance Use Topics  . Alcohol use: No  . Drug use: No    Home Medications Prior to Admission medications   Medication Sig Start Date End Date Taking? Authorizing Provider  acetaminophen (TYLENOL) 325 MG tablet Take 2 tablets (650 mg total) by mouth every 6 (six) hours as needed for mild pain (or temp > 100). 02/16/20   Emokpae, Courage, MD  amLODipine (NORVASC) 2.5 MG tablet Take 1 tablet (2.5 mg total) by mouth daily. For BP 02/17/20   Emokpae, Courage, MD  atorvastatin (LIPITOR) 20 MG tablet Take 20 mg by mouth every evening.  11/18/19   [provider]  HYDROcodone-acetaminophen (NORCO) 5-325 MG tablet Take 1 tablet by mouth every 6 (six) hours as needed for severe pain. 02/16/20   Roxan Hockey, MD  levothyroxine (SYNTHROID) 100 MCG tablet Take 100 mcg by mouth every morning. 11/20/19   [provider]  prochlorperazine (COMPAZINE) 10 MG tablet Take 1 tablet (10 mg total) by mouth every 6 (six) hours as needed for nausea, vomiting or refractory nausea / vomiting. 02/16/20   Roxan Hockey, MD    Allergies    Cucumber  extract and Strawberry (diagnostic)  Review of Systems   Review of Systems  Cardiovascular: Negative for chest pain.  Gastrointestinal: Negative for abdominal pain.  Genitourinary: Negative for pelvic pain.  Musculoskeletal: Positive for back pain.  All other systems reviewed and are negative.   Physical Exam Updated Vital Signs BP (!) 166/77 (BP Location: Right Arm)   Pulse 75   Temp 98.3 F (36.8 C) (Oral)   Resp 20   Ht 5' (1.524 m)   Wt 55.8 kg   SpO2 97%   BMI 24.02 kg/m   Physical Exam Vitals and nursing note reviewed.  Constitutional:      Appearance: Normal appearance.  HENT:      Head: Atraumatic.     Nose: Nose normal.     Mouth/Throat:     Mouth: Mucous membranes are moist.  Eyes:     Conjunctiva/sclera: Conjunctivae normal.  Cardiovascular:     Rate and Rhythm: Normal rate.  Pulmonary:     Effort: Pulmonary effort is normal.  Abdominal:     Palpations: Abdomen is soft.  Musculoskeletal:        General: Tenderness present. No deformity. Normal range of motion.     Cervical back: Normal range of motion.     Lumbar back: No bony tenderness. Normal range of motion.       Back:     Comments: No deformity of hip. Good range of motion without skeletal pain. Distal PMS intact.  Skin:    General: Skin is warm and dry.  Neurological:     Mental Status: She is alert and oriented to person, place, and time.  Psychiatric:        Mood and Affect: Mood normal.        Behavior: Behavior normal.     ED Results / Procedures / Treatments   Labs (all labs ordered are listed, but only abnormal results are displayed) Labs Reviewed - No data to display  EKG None  Radiology No results found.  Procedures Procedures   Medications Ordered in ED Medications - No data to display  ED Course  I have reviewed the triage vital signs and the nursing notes.  Pertinent labs & imaging results that were available during my care of the patient were reviewed by me and considered in my medical decision making (see chart for details).    MDM Rules/Calculators/A&P                          Patient with non-skeletal  back pain, buttock contusion.  No neurological deficits and normal neuro exam.  Patient is ambulatory.  No loss of bowel or bladder control.  No concern for cauda equina.  No fever, night sweats, weight loss, h/o cancer, IVDA, no recent procedure to back. No urinary symptoms suggestive of UTI.  Supportive care and return precaution discussed. Appears safe for discharge at this time. Follow up as indicated in discharge paperwork.  Final Clinical Impression(s) / ED  Diagnoses Final diagnoses:  Fall, initial encounter  Contusion of buttock, initial encounter    Rx / DC Orders ED Discharge Orders    None       Etta Quill, NP 01/05/21 1814    Dorie Rank, MD 01/08/21 1312

## 2021-01-05 NOTE — ED Triage Notes (Signed)
EMS reports pt fell from a step ladder.  C/o pain in lower back and left hip.   EMs says pt has a bruise to buttocks and left hip.  Pt says back hurts in the center of her back.  Denies hitting head or any loss of consciousness.

## 2021-01-05 NOTE — Discharge Instructions (Signed)
Apply ice to affected area several times per day for the next 24 hours. You may then apply heat and/or cold. Tylenol for discomfort. You may occasionally use ibuprofen if the tylenol is not effective.

## 2021-01-06 ENCOUNTER — Encounter (HOSPITAL_COMMUNITY): Payer: Self-pay | Admitting: Emergency Medicine

## 2021-01-06 ENCOUNTER — Emergency Department (HOSPITAL_COMMUNITY): Payer: Medicare Other

## 2021-01-06 ENCOUNTER — Encounter: Payer: Self-pay | Admitting: Emergency Medicine

## 2021-01-06 ENCOUNTER — Other Ambulatory Visit: Payer: Self-pay

## 2021-01-06 ENCOUNTER — Emergency Department (HOSPITAL_COMMUNITY)
Admission: EM | Admit: 2021-01-06 | Discharge: 2021-01-06 | Disposition: A | Discharge status: Home / Self Care | Payer: Medicare Other | Attending: Emergency Medicine | Admitting: Emergency Medicine

## 2021-01-06 ENCOUNTER — Ambulatory Visit
Admission: EM | Admit: 2021-01-06 | Discharge: 2021-01-06 | Disposition: A | Discharge status: Home / Self Care | Payer: Medicare Other

## 2021-01-06 DIAGNOSIS — Y92009 Unspecified place in unspecified non-institutional (private) residence as the place of occurrence of the external cause: Secondary | ICD-10-CM | POA: Insufficient documentation

## 2021-01-06 DIAGNOSIS — W11XXXA Fall on and from ladder, initial encounter: Secondary | ICD-10-CM | POA: Insufficient documentation

## 2021-01-06 DIAGNOSIS — R1110 Vomiting, unspecified: Secondary | ICD-10-CM | POA: Diagnosis not present

## 2021-01-06 DIAGNOSIS — W19XXXA Unspecified fall, initial encounter: Secondary | ICD-10-CM

## 2021-01-06 DIAGNOSIS — Z043 Encounter for examination and observation following other accident: Secondary | ICD-10-CM | POA: Diagnosis not present

## 2021-01-06 DIAGNOSIS — S22080A Wedge compression fracture of T11-T12 vertebra, initial encounter for closed fracture: Secondary | ICD-10-CM

## 2021-01-06 DIAGNOSIS — M545 Low back pain, unspecified: Secondary | ICD-10-CM | POA: Diagnosis not present

## 2021-01-06 DIAGNOSIS — R1012 Left upper quadrant pain: Secondary | ICD-10-CM | POA: Diagnosis not present

## 2021-01-06 DIAGNOSIS — S299XXA Unspecified injury of thorax, initial encounter: Secondary | ICD-10-CM | POA: Diagnosis present

## 2021-01-06 DIAGNOSIS — M25552 Pain in left hip: Secondary | ICD-10-CM | POA: Diagnosis not present

## 2021-01-06 DIAGNOSIS — S22088A Other fracture of T11-T12 vertebra, initial encounter for closed fracture: Secondary | ICD-10-CM | POA: Insufficient documentation

## 2021-01-06 DIAGNOSIS — R0902 Hypoxemia: Secondary | ICD-10-CM

## 2021-01-06 DIAGNOSIS — I10 Essential (primary) hypertension: Secondary | ICD-10-CM | POA: Diagnosis not present

## 2021-01-06 DIAGNOSIS — S32018A Other fracture of first lumbar vertebra, initial encounter for closed fracture: Secondary | ICD-10-CM | POA: Diagnosis not present

## 2021-01-06 DIAGNOSIS — R110 Nausea: Secondary | ICD-10-CM | POA: Diagnosis not present

## 2021-01-06 DIAGNOSIS — M546 Pain in thoracic spine: Secondary | ICD-10-CM | POA: Diagnosis not present

## 2021-01-06 DIAGNOSIS — E876 Hypokalemia: Secondary | ICD-10-CM | POA: Diagnosis not present

## 2021-01-06 DIAGNOSIS — D72829 Elevated white blood cell count, unspecified: Secondary | ICD-10-CM | POA: Diagnosis not present

## 2021-01-06 DIAGNOSIS — Z79899 Other long term (current) drug therapy: Secondary | ICD-10-CM | POA: Diagnosis not present

## 2021-01-06 DIAGNOSIS — R112 Nausea with vomiting, unspecified: Secondary | ICD-10-CM | POA: Diagnosis not present

## 2021-01-06 LAB — COMPREHENSIVE METABOLIC PANEL
ALT: 23 U/L (ref 0–44)
AST: 29 U/L (ref 15–41)
Albumin: 5.1 g/dL — ABNORMAL HIGH (ref 3.5–5.0)
Alkaline Phosphatase: 141 U/L — ABNORMAL HIGH (ref 38–126)
Anion gap: 10 (ref 5–15)
BUN: 10 mg/dL (ref 8–23)
CO2: 23 mmol/L (ref 22–32)
Calcium: 9.8 mg/dL (ref 8.9–10.3)
Chloride: 102 mmol/L (ref 98–111)
Creatinine, Ser: 0.64 mg/dL (ref 0.44–1.00)
GFR, Estimated: >60 mL/min (ref >60)
Glucose, Bld: 133 mg/dL — ABNORMAL HIGH (ref 70–99)
Potassium: 3.4 mmol/L — ABNORMAL LOW (ref 3.5–5.1)
Sodium: 135 mmol/L (ref 135–145)
Total Bilirubin: 1.2 mg/dL (ref 0.3–1.2)
Total Protein: 8.4 g/dL — ABNORMAL HIGH (ref 6.5–8.1)

## 2021-01-06 LAB — CBC WITH DIFFERENTIAL/PLATELET
Band Neutrophils: 1 %
Basophils Absolute: 0.1 10*3/uL (ref 0.0–0.1)
Basophils Relative: 1 %
Eosinophils Absolute: 0 10*3/uL (ref 0.0–0.5)
Eosinophils Relative: 0 %
HCT: 46.7 % — ABNORMAL HIGH (ref 36.0–46.0)
Hemoglobin: 15.8 g/dL — ABNORMAL HIGH (ref 12.0–15.0)
Lymphocytes Relative: 12 %
Lymphs Abs: 1.6 10*3/uL (ref 0.7–4.0)
MCH: 30.7 pg (ref 26.0–34.0)
MCHC: 33.8 g/dL (ref 30.0–36.0)
MCV: 90.7 fL (ref 80.0–100.0)
Monocytes Absolute: 0.3 10*3/uL (ref 0.1–1.0)
Monocytes Relative: 2 %
Neutro Abs: 11.6 10*3/uL — ABNORMAL HIGH (ref 1.7–7.7)
Neutrophils Relative %: 84 %
Platelets: 308 10*3/uL (ref 150–400)
RBC: 5.15 MIL/uL — ABNORMAL HIGH (ref 3.87–5.11)
RDW: 13.2 % (ref 11.5–15.5)
WBC: 13.7 10*3/uL — ABNORMAL HIGH (ref 4.0–10.5)
nRBC: 0 % (ref 0.0–0.2)

## 2021-01-06 LAB — URINALYSIS, ROUTINE W REFLEX MICROSCOPIC
Bilirubin Urine: NEGATIVE
Glucose, UA: 50 mg/dL — AB
Hgb urine dipstick: NEGATIVE
Ketones, ur: 20 mg/dL — AB
Leukocytes,Ua: NEGATIVE
Nitrite: NEGATIVE
Protein, ur: 30 mg/dL — AB
Specific Gravity, Urine: 1.018 (ref 1.005–1.030)
pH: 6 (ref 5.0–8.0)

## 2021-01-06 LAB — LIPASE, BLOOD: Lipase: 26 U/L (ref 11–51)

## 2021-01-06 MED ORDER — HYDROCODONE-ACETAMINOPHEN 5-325 MG PO TABS: 0.5000 | ORAL_TABLET | Freq: Four times a day (QID) | ORAL | 0 refills | Status: DC | PRN

## 2021-01-06 MED ORDER — IBUPROFEN 400 MG PO TABS
400.0000 mg | ORAL_TABLET | Freq: Once | ORAL | Status: AC
  Administered 2021-01-06: 400 mg via ORAL
  Filled 2021-01-06: qty 1

## 2021-01-06 MED ORDER — SODIUM CHLORIDE 0.9 % IV BOLUS
250.0000 mL | Freq: Once | INTRAVENOUS | Status: AC
  Administered 2021-01-06: 250 mL via INTRAVENOUS

## 2021-01-06 MED ORDER — MORPHINE SULFATE (PF) 2 MG/ML IV SOLN
2.0000 mg | Freq: Once | INTRAVENOUS | Status: AC
  Administered 2021-01-06: 2 mg via INTRAVENOUS
  Filled 2021-01-06: qty 1

## 2021-01-06 MED ORDER — PROCHLORPERAZINE MALEATE 5 MG PO TABS: 10.0000 mg | ORAL_TABLET | Freq: Once | ORAL | Status: DC

## 2021-01-06 MED ORDER — AMLODIPINE BESYLATE 5 MG PO TABS
2.5000 mg | ORAL_TABLET | Freq: Once | ORAL | Status: DC
  Filled 2021-01-06: qty 1

## 2021-01-06 MED ORDER — AMLODIPINE BESYLATE 5 MG PO TABS: 5.0000 mg | ORAL_TABLET | Freq: Once | ORAL | Status: DC

## 2021-01-06 MED ORDER — PROCHLORPERAZINE EDISYLATE 10 MG/2ML IJ SOLN
10.0000 mg | Freq: Once | INTRAMUSCULAR | Status: AC
  Administered 2021-01-06: 10 mg via INTRAVENOUS
  Filled 2021-01-06: qty 2

## 2021-01-06 MED ORDER — PROCHLORPERAZINE MALEATE 10 MG PO TABS: 10.0000 mg | ORAL_TABLET | Freq: Three times a day (TID) | ORAL | 0 refills | Status: DC | PRN

## 2021-01-06 MED ORDER — ONDANSETRON HCL 4 MG PO TABS: 4.0000 mg | ORAL_TABLET | Freq: Four times a day (QID) | ORAL | 0 refills | Status: DC

## 2021-01-06 MED ORDER — PROCHLORPERAZINE EDISYLATE 10 MG/2ML IJ SOLN
5.0000 mg | Freq: Once | INTRAMUSCULAR | Status: AC
  Administered 2021-01-06: 5 mg via INTRAVENOUS
  Filled 2021-01-06: qty 2

## 2021-01-06 NOTE — ED Triage Notes (Signed)
Pt reports she fell from a 2-foot step ladder yesterday and was having left hip pain, was seen here, after visit pt has had abd pain with multiple episodes of emesis; was seen at [facility] today and advised to come to ED

## 2021-01-06 NOTE — ED Provider Notes (Addendum)
[facility] EMERGENCY DEPARTMENT Provider Note   CSN: [id number] Arrival date & time: 01/06/21  1411     History Chief Complaint  Patient presents with   Emesis    Olivia Haas is a 87 y.o. female with a history of thyroid disease, hypercholesterolemia and hypertension presenting for reevaluation after she fell from a ladder yesterday.  She was on the second rung from the bottom in her home trying to hang a curtain when she lost her balance and fell backwards landing on her left pelvis and hip region.  She denies hitting her head, denies LOC and has no other complaints of pain from her fall including headache, neck or back pain.  She was seen here yesterday and she was felt stable for discharge home with diagnosis of buttock contusion.  Upon leaving the ED she developed nausea and has had multiple episodes of vomiting.  She denies fevers or chills, she denies abdominal pain, but reports an uncomfortable feeling in her epigastric and left upper abdomen which does improve with vomiting.  She denies hematemesis, also has no headache, dizziness, confusion, denies focal weakness, neck or back pain.  She is not on anticoagulant medications.  She is ambulatory and was immediately after her fall.  She has had no medications prior to arrival for her pain or her vomiting.  Of note, bp is elevated today - she has not taken her home amlodipine today.  The history is provided by the patient.      Past Medical History:  Diagnosis Date   High cholesterol    Thyroid disease     Patient Active Problem List   Diagnosis Date Noted   Shingles rash- Rt Flank 02/16/2020   Elevated BP without diagnosis of hypertension 02/15/2020   Hyponatremia 02/15/2020   Hypokalemia 02/15/2020   Symptomatic cholelithiasis--status post lap chole 02/13/20 02/12/2020   Intractable nausea and vomiting 70/17/7939   Biliary colic 03/00/9233   History of lumbar laminectomy for spinal cord decompression 12/04/2019    Past  Surgical History:  Procedure Laterality Date   ABDOMINAL HYSTERECTOMY     ANKLE SURGERY Right 2004   CHOLECYSTECTOMY N/A 02/13/2020   Procedure: LAPAROSCOPIC CHOLECYSTECTOMY;  Surgeon: Virl Cagey, MD;  Location: [facility];  Service: General;  Laterality: N/A;   TONSILLECTOMY       OB History   No obstetric history on file.     Family History  Problem Relation Age of Onset   Heart attack Father    Anesthesia problems Daughter        Sensitive to medication/ waking up     Social History   Tobacco Use   Smoking status: Never Smoker   Smokeless tobacco: Never Used  Vaping Use   Vaping Use: Never used  Substance Use Topics   Alcohol use: No   Drug use: No    Home Medications Prior to Admission medications   Medication Sig Start Date End Date Taking? Authorizing Provider  acetaminophen (TYLENOL) 325 MG tablet Take 2 tablets (650 mg total) by mouth every 6 (six) hours as needed for mild pain (or temp > 100). 02/16/20   Emokpae, Courage, MD  amLODipine (NORVASC) 2.5 MG tablet Take 1 tablet (2.5 mg total) by mouth daily. For BP 02/17/20   Emokpae, Courage, MD  atorvastatin (LIPITOR) 20 MG tablet Take 20 mg by mouth every evening.  11/18/19   [provider]  HYDROcodone-acetaminophen (NORCO) 5-325 MG tablet Take 1 tablet by mouth every 6 (six) hours as  needed for severe pain. 02/16/20   Roxan Hockey, MD  levothyroxine (SYNTHROID) 100 MCG tablet Take 100 mcg by mouth every morning. 11/20/19   [provider]  prochlorperazine (COMPAZINE) 10 MG tablet Take 1 tablet (10 mg total) by mouth every 6 (six) hours as needed for nausea, vomiting or refractory nausea / vomiting. 02/16/20   Roxan Hockey, MD    Allergies    Cucumber extract and Strawberry (diagnostic)  Review of Systems   Review of Systems  Constitutional:  Negative for chills and fever.  HENT:  Negative for congestion and sore throat.   Eyes: Negative.   Respiratory:  Negative for chest tightness and  shortness of breath.   Cardiovascular:  Negative for chest pain.  Gastrointestinal:  Positive for abdominal pain, nausea and vomiting.  Genitourinary: Negative.   Musculoskeletal:  Positive for arthralgias. Negative for back pain, joint swelling and neck pain.  Skin: Negative.  Negative for rash and wound.  Neurological:  Negative for dizziness, facial asymmetry, weakness, light-headedness, numbness and headaches.  Psychiatric/Behavioral: Negative.     Physical Exam Updated Vital Signs BP 94/77   Pulse 86   Temp 98.3 F (36.8 C) (Oral)   Resp 19   Ht 5' (1.524 m)   Wt 55.8 kg   SpO2 96%   BMI 24.02 kg/m   Physical Exam Vitals and nursing note reviewed.  Constitutional:      General: She is not in acute distress.    Appearance: She is well-developed.  HENT:     Head: Normocephalic and atraumatic.  Eyes:     Conjunctiva/sclera: Conjunctivae normal.  Cardiovascular:     Rate and Rhythm: Normal rate and regular rhythm.     Heart sounds: Normal heart sounds.  Pulmonary:     Effort: Pulmonary effort is normal.     Breath sounds: Normal breath sounds. No wheezing.  Abdominal:     General: Bowel sounds are normal.     Palpations: Abdomen is soft.     Tenderness: There is abdominal tenderness. There is no right CVA tenderness, left CVA tenderness, guarding or rebound. Negative signs include Murphy's sign.     Comments: Mild tenderness in the left upper quadrant, there is no guarding or rebound.  Musculoskeletal:        General: Normal range of motion.     Cervical back: Normal range of motion.       Back:     Comments: No midline tenderness to palpation of the C-spine, T-spine or lumbar spine.  She does have some tenderness across to her left posterior pelvis radiating into the left lateral hip region.  Skin:    General: Skin is warm and dry.  Neurological:     Mental Status: She is alert.    ED Results / Procedures / Treatments   Labs (all labs ordered are listed, but  only abnormal results are displayed) Labs Reviewed  CBC WITH DIFFERENTIAL/PLATELET - Abnormal; Notable for the following components:      Result Value   WBC 13.7 (*)    RBC 5.15 (*)    Hemoglobin 15.8 (*)    HCT 46.7 (*)    Neutro Abs 11.6 (*)    All other components within normal limits  COMPREHENSIVE METABOLIC PANEL - Abnormal; Notable for the following components:   Potassium 3.4 (*)    Glucose, Bld 133 (*)    Total Protein 8.4 (*)    Albumin 5.1 (*)    Alkaline Phosphatase 141 (*)  All other components within normal limits  LIPASE, BLOOD  URINALYSIS, ROUTINE W REFLEX MICROSCOPIC    EKG None  Radiology DG Chest 1 View  Result Date: 01/06/2021 CLINICAL DATA:  Nausea vomiting EXAM: CHEST  1 VIEW COMPARISON:  02/12/2020 FINDINGS: The heart size and mediastinal contours are within normal limits. Both lungs are clear. The visualized skeletal structures are unremarkable. IMPRESSION: No active disease. Electronically Signed   By: Donavan Foil M.D.   On: 01/06/2021 17:09   DG Thoracic Spine 2 View  Result Date: 01/06/2021 CLINICAL DATA:  Fall yesterday.  Thoracic back pain. EXAM: THORACIC SPINE 2 VIEWS COMPARISON:  None. FINDINGS: Superior endplate compression deformities are seen involving the T12 and L1 vertebral bodies, which are likely acute. Alignment remains normal. Mild degenerative osteophytosis noted, however intervertebral disc spaces are maintained. Mild upper thoracic levoscoliosis noted. IMPRESSION: Superior endplate compression deformities of T12 and L1 vertebral bodies, which are likely acute. Electronically Signed   By: Marlaine Hind M.D.   On: 01/06/2021 18:12   DG Lumbar Spine Complete  Result Date: 01/06/2021 CLINICAL DATA:  Fall with low back pain EXAM: LUMBAR SPINE - COMPLETE 4+ VIEW COMPARISON:  CT 02/11/2020 FINDINGS: Transitional anatomy suspected with 6 non rib-bearing lumbar type vertebra. For the purposes of reporting, last well-formed lumbar type vertebra  will be designated L5. Lumbar alignment within normal limits. Incompletely visualized superior endplate deformity at Z66. Aortic atherosclerosis. Disc spaces are patent. Mild facet degenerative changes of the lower lumbar spine IMPRESSION: Suspected transitional anatomy as above. Incompletely visualized superior endplate deformity at Q94 Electronically Signed   By: Donavan Foil M.D.   On: 01/06/2021 17:14   CT Head Wo Contrast  Result Date: 01/06/2021 CLINICAL DATA:  Fall from step ladder. EXAM: CT HEAD WITHOUT CONTRAST TECHNIQUE: Contiguous axial images were obtained from the base of the skull through the vertex without intravenous contrast. COMPARISON:  None. FINDINGS: Brain: There is no evidence for acute hemorrhage, hydrocephalus, mass lesion, or abnormal extra-axial fluid collection. No definite CT evidence for acute infarction. Vascular: No hyperdense vessel or unexpected calcification. Skull: Normal. Negative for fracture or focal lesion. Sinuses/Orbits: The visualized paranasal sinuses and mastoid air cells are clear. Visualized portions of the globes and intraorbital fat are unremarkable. Other: None. IMPRESSION: No acute intracranial abnormality. Electronically Signed   By: Misty Stanley M.D.   On: 01/06/2021 15:35   DG Hip Unilat W or Wo Pelvis 2-3 Views Left  Result Date: 01/06/2021 CLINICAL DATA:  Lower back and LEFT hip pain, fell yesterday EXAM: DG HIP (WITH OR WITHOUT PELVIS) 2-3V LEFT COMPARISON:  None FINDINGS: Osseous demineralization. Narrowing of the hip joints bilaterally. SI joints preserved. No acute fracture, dislocation, or bone destruction. Scattered atherosclerotic calcifications at the iliac arteries. IMPRESSION: Osseous demineralization with mild degenerative changes of the hip joints bilaterally. No acute osseous findings. Electronically Signed   By: Lavonia Dana M.D.   On: 01/06/2021 15:33    Procedures Procedures   Medications Ordered in ED Medications  amLODipine  (NORVASC) tablet 2.5 mg (has no administration in time range)  prochlorperazine (COMPAZINE) injection 10 mg (10 mg Intravenous Given 01/06/21 1628)  morphine 2 MG/ML injection 2 mg (2 mg Intravenous Given 01/06/21 1628)  sodium chloride 0.9 % bolus 250 mL (0 mLs Intravenous Stopped 01/06/21 1730)    ED Course  I have reviewed the triage vital signs and the nursing notes.  Pertinent labs & imaging results that were available during my care of the patient were  reviewed by me and considered in my medical decision making (see chart for details).    MDM Rules/Calculators/A&P                          Patient with a fall yesterday, now with nausea, vomiting and mild left upper quadrant pain.  So far her imaging is negative for any acute injuries from her fall.  There is an elevated WBC count at 13.7.  Daughter at the bedside states she has had problems with UTIs which have been asymptomatic except for WBC counts.  Patient does deny dysuria at this time, urine is pending.  6:45 PM Imaging reviewed and her lumbar spine suggests a possible injury at the T12, she was sent back for thoracic film and she indeed does have a T12 and L1 compression fracture which appears acute.  She was given IV morphine 2 mg and Compazine with no further emesis and improvement in her pain.  Still pending urinalysis, urine sample to be collected.  Patient was given a 250 cc bolus of normal saline.    Anticipate discharge home with pain and nausea medicine and referral to interventional radiology for consideration of kyphoplasty.   +/- tx if UA suggests uti. Pt's WBC  may be elevated secondary to recent fall and persistent pain.  Labs otherwise stable.    Pt discussed with Dr. Sabra Heck who also saw patient during this visit.  Addendum:  Given patients age and mechanism of injury, given development of nausea after her fall,  CT brain imaging obtained to rule out contrecoup internal brain bleed. She did not have a direct blow to  her head,  but describes significant impact with her fall.    Final Clinical Impression(s) / ED Diagnoses Final diagnoses:  Fall, initial encounter  T12 compression fracture, initial encounter ([place])  Non-intractable vomiting with nausea, unspecified vomiting type    Rx / DC Orders ED Discharge Orders     None        Landis Martins 01/06/21 1854    Noemi Chapel, MD 01/06/21 Carnella Gu[place], PA-C 01/26/21 1259    Noemi Chapel, MD 01/29/21 [phone]

## 2021-01-06 NOTE — ED Notes (Signed)
Patient transported to X-ray 

## 2021-01-06 NOTE — ED Provider Notes (Signed)
Medical screening examination/treatment/procedure(s) were conducted as a shared visit with non-physician practitioner(s) and myself.  I personally evaluated the patient during the encounter.  Clinical Impression:   Final diagnoses:  Fall, initial encounter  T12 compression fracture, initial encounter ([place])  Non-intractable vomiting with nausea, unspecified vomiting type    Had fall from ladder to buttock yesterday - has n/v immediately after the fall - came back today for reeval due to vomiting.  Well appaering, VS with hypertension, CT neg head, hip neg, labs pending r/o UTI.  EKG reassuring  UA without obvious infectio - fluids given IV and PO - pt well appaering at d/c - requesting compazine for home.  Referral to the interventional radiolgoy given.   ED ECG REPORT  I personally interpreted this EKG   Date: 01/06/2021   Rate: 80  Rhythm: normal sinus rhythm  QRS Axis: normal  Intervals: normal  ST/T Wave abnormalities: normal  Conduction Disutrbances:none  Narrative Interpretation:   Old EKG Reviewed: none available    Noemi Chapel, MD 01/06/21 2030

## 2021-01-06 NOTE — ED Triage Notes (Addendum)
Pt seen at [facility] for fall off a step ladder.  States she lost her balance and remembers falling and did not hit her head.  Having pain to middle of lower back since the fall.  Did not have any images done while in the ED yesterday.  Pt also reports she has been vomiting since the incident.  Pt ambulatory from waiting room to the room.  Would like something for nausea and xray of back.  zofran does nothing for patients nausea.

## 2021-01-06 NOTE — Discharge Instructions (Addendum)
Your x-rays show that you have an acute fracture in your spine as discussed.  You may benefit from a kyphoplasty procedure to heal this injury which can also help improve the pain much quicker than if it heals naturally.  Call Dr. Estanislado Pandy as listed above to discuss whether you would be a good candidate for this procedure.  In the interim use the medications prescribed for pain and nausea.  Use caution with the pain medicine as this will make you drowsy, it can also cause constipation.  I recommend taking a stool softener of choice such as Colace while you are taking this pain medication.  Do not drive within 4 hours of taking hydrocodone due to its sedating properties.

## 2021-01-06 NOTE — ED Notes (Signed)
Patient is being discharged from the Urgent Care and sent to the Emergency Department via pov . Per [place], [place], patient is in need of higher level of care due to in retractable vomiting after fall . Patient is aware and verbalizes understanding of plan of care.  Vitals:   01/06/21 1355  BP: (!) 153/81  Pulse: 75  Resp: 18  Temp: 98.2 F (36.8 C)  SpO2: 91%

## 2021-01-08 DIAGNOSIS — M545 Low back pain, unspecified: Secondary | ICD-10-CM | POA: Diagnosis not present

## 2021-01-08 DIAGNOSIS — Z6824 Body mass index (BMI) 24.0-24.9, adult: Secondary | ICD-10-CM | POA: Diagnosis not present

## 2021-01-08 DIAGNOSIS — R112 Nausea with vomiting, unspecified: Secondary | ICD-10-CM | POA: Diagnosis not present

## 2021-01-08 DIAGNOSIS — S22000A Wedge compression fracture of unspecified thoracic vertebra, initial encounter for closed fracture: Secondary | ICD-10-CM | POA: Diagnosis not present

## 2021-01-13 DIAGNOSIS — S22080D Wedge compression fracture of T11-T12 vertebra, subsequent encounter for fracture with routine healing: Secondary | ICD-10-CM | POA: Diagnosis not present

## 2021-02-09 ENCOUNTER — Encounter: Payer: Self-pay | Admitting: Orthopedic Surgery

## 2021-02-09 ENCOUNTER — Ambulatory Visit: Payer: Medicare Other

## 2021-02-09 ENCOUNTER — Ambulatory Visit (INDEPENDENT_AMBULATORY_CARE_PROVIDER_SITE_OTHER): Payer: Medicare Other | Admitting: Orthopedic Surgery

## 2021-02-09 ENCOUNTER — Other Ambulatory Visit: Payer: Self-pay

## 2021-02-09 VITALS — BP 145/97 | HR 87 | Ht 60.0 in | Wt 112.0 lb

## 2021-02-09 DIAGNOSIS — S22000A Wedge compression fracture of unspecified thoracic vertebra, initial encounter for closed fracture: Secondary | ICD-10-CM | POA: Diagnosis not present

## 2021-02-09 DIAGNOSIS — S3992XA Unspecified injury of lower back, initial encounter: Secondary | ICD-10-CM

## 2021-02-09 NOTE — Progress Notes (Signed)
New Patient Visit  Assessment: Olivia Haas is a 87 y.o. female with the following: T12 compression fracture  Plan: Reviewed radiographs which demonstrate ~50% compression of T12 vertebra.  Patient reports pain is improving.  No concern for nerve compression at this time.  Tylenol as needed for pain.  These injuries can take 2-3 months to fully heal.  Recommend PT for gait stability.  Referral placed today.  Follow up as needed.    Follow-up: Return if symptoms worsen or fail to improve.  Subjective:  Chief Complaint  Patient presents with   Back Injury    Pt fell 01/05/21 ans was told she had fractures, has relieved pain with tylenol use. States she's felt much better the past 2 days.     History of Present Illness: Olivia Haas is a 87 y.o. female who presents to clinic today for back pain.  She fell approximately one month ago.  She followed up with her primary care provider who recommended she be evaluated.  Her pain is improving.   She has one spot of pain in her lower back, right side.  No numbness or tingling.  She is able to walk more and more each day.  She is walking in the house.  She states she does not have the confidence to drive at this point.  No pain elsewhere.    Review of Systems: No fevers or chills No numbness or tingling No chest pain No shortness of breath No bowel or bladder dysfunction No GI distress No headaches   Medical History:  Past Medical History:  Diagnosis Date   High cholesterol    Thyroid disease     Past Surgical History:  Procedure Laterality Date   ABDOMINAL HYSTERECTOMY     ANKLE SURGERY Right 2004   CHOLECYSTECTOMY N/A 02/13/2020   Procedure: LAPAROSCOPIC CHOLECYSTECTOMY;  Surgeon: Virl Cagey, MD;  Location: [facility];  Service: General;  Laterality: N/A;   TONSILLECTOMY      Family History  Problem Relation Age of Onset   Heart attack Father    Anesthesia problems Daughter        Sensitive to medication/ waking  up    Social History   Tobacco Use   Smoking status: Never   Smokeless tobacco: Never  Vaping Use   Vaping Use: Never used  Substance Use Topics   Alcohol use: No   Drug use: No    Allergies  Allergen Reactions   Cucumber Extract Other (See Comments)    Vomiting   Strawberry (Diagnostic) Other (See Comments)    Vomiting     Current Meds  Medication Sig   acetaminophen (TYLENOL) 325 MG tablet Take 2 tablets (650 mg total) by mouth every 6 (six) hours as needed for mild pain (or temp > 100).   atorvastatin (LIPITOR) 20 MG tablet Take 20 mg by mouth every evening.    levothyroxine (SYNTHROID) 100 MCG tablet Take 100 mcg by mouth every morning.    Objective: BP (!) 145/97   Pulse 87   Ht 5' (1.524 m)   Wt 112 lb (50.8 kg)   BMI 21.87 kg/m   Physical Exam:  General:  Elderly female. No acute distress. Alert and oriented Gait: Slow, steady gait  Mild tenderness in the lower back, right side.  No radiating pain into her right leg.  Negative straight leg raise.   5/5 strength in quadriceps, hamstrings, TA, EHL and Gastroc bilaterally.  Sensation intact in all dermatomes.   Toes are  warm and well perfused.     IMAGING: I personally ordered and reviewed the following images  X-rays of the thoracic and lumbar spine, in the upright position were obtained in clinic today.  These x-rays demonstrate a compression, wedge fracture at T12.  No significant progression since x-rays obtained approximately 1 month ago.  No retropulsion of the fragments.  Slight kyphosis at this level.  Impression: T12 compression fracture, less than 50% of total height without significant kyphosis.   New Medications:  No orders of the defined types were placed in this encounter.     Mordecai Rasmussen, MD  02/09/2021 10:33 AM

## 2021-02-22 DIAGNOSIS — R638 Other symptoms and signs concerning food and fluid intake: Secondary | ICD-10-CM | POA: Diagnosis not present

## 2021-02-22 DIAGNOSIS — E038 Other specified hypothyroidism: Secondary | ICD-10-CM | POA: Diagnosis not present

## 2021-02-22 DIAGNOSIS — Z79899 Other long term (current) drug therapy: Secondary | ICD-10-CM | POA: Diagnosis not present

## 2021-02-22 DIAGNOSIS — R630 Anorexia: Secondary | ICD-10-CM | POA: Diagnosis not present

## 2021-02-22 DIAGNOSIS — I209 Angina pectoris, unspecified: Secondary | ICD-10-CM | POA: Diagnosis not present

## 2021-02-22 DIAGNOSIS — D518 Other vitamin B12 deficiency anemias: Secondary | ICD-10-CM | POA: Diagnosis not present

## 2021-02-22 DIAGNOSIS — E7849 Other hyperlipidemia: Secondary | ICD-10-CM | POA: Diagnosis not present

## 2021-02-22 DIAGNOSIS — R5383 Other fatigue: Secondary | ICD-10-CM | POA: Diagnosis not present

## 2021-02-24 ENCOUNTER — Other Ambulatory Visit: Payer: Self-pay

## 2021-02-24 ENCOUNTER — Encounter (INDEPENDENT_AMBULATORY_CARE_PROVIDER_SITE_OTHER): Payer: Self-pay | Admitting: Internal Medicine

## 2021-02-24 ENCOUNTER — Ambulatory Visit (INDEPENDENT_AMBULATORY_CARE_PROVIDER_SITE_OTHER): Payer: Medicare Other | Admitting: Internal Medicine

## 2021-02-24 DIAGNOSIS — R748 Abnormal levels of other serum enzymes: Secondary | ICD-10-CM

## 2021-02-24 DIAGNOSIS — R634 Abnormal weight loss: Secondary | ICD-10-CM | POA: Diagnosis not present

## 2021-02-24 MED ORDER — FAMOTIDINE 40 MG PO TABS: 40.0000 mg | ORAL_TABLET | Freq: Every day | ORAL | 5 refills | Status: DC

## 2021-02-24 NOTE — Progress Notes (Signed)
Reason for consultation  Weight loss  History of present illness  Patient is 87 year old Caucasian female who is referred through courtesy of Dr. Sherrie Sport for GI evaluation.  Patient was seen by him last week and noted to have lost close to 13 pounds.  Patient was begun on Remeron 7.5 mg daily.  Olivia Haas has taken 1 dose and not sure that Olivia Haas want to continue this medication. Patient states Olivia Haas was doing well until 01/06/2021 when Olivia Haas fell off a stepladder while trying to hang a curtain.  Olivia Haas developed acute back pain.  Olivia Haas was seen in emergency room and noted to have compression fracture to T12.  Patient states early on Olivia Haas had severe pain associate with nausea and vomiting.  Olivia Haas was not able to eat.  Olivia Haas says nausea and vomiting continued for about 2 weeks but has not had any since then.  Olivia Haas states every time Olivia Haas has severe pain Olivia Haas would get nausea and vomiting.  Olivia Haas did not experience hematemesis melena or rectal bleeding.  Olivia Haas has not taken NSAIDs.  Olivia Haas was given prescription for pain medication by ER physician but Olivia Haas has not used it.  Olivia Haas has been taking Tylenol on as-needed basis.  Olivia Haas was seen by Dr. Larena Glassman and kyphoplasty was ordered.  Patient has noted significant decrease in severity of her pain.  Olivia Haas is not interested in this therapy. Olivia Haas denies heartburn or epigastric pain.  No prior history of peptic ulcer disease.  Olivia Haas does report intermittent diarrhea which usually occurs after breakfast.  This started after Olivia Haas had gallbladder surgery 1 year ago.  Olivia Haas generally has no more than 1 stool per day. Olivia Haas does not feel depressed but Olivia Haas feels frustrated as Olivia Haas feels that her recovery has been slow. Her daughter [name] who is accompanying her today states that Olivia Haas complained of heaviness in her left arm forearm on 3 different occasions.  Her blood pressure has been fluctuating.  It is high in the evening.  Olivia Haas has been referred for cardiology evaluation but appointment is not until 2 months from  now.  Her daughter is also noted to be short of breath at times.  No history of chest pain though. Patient lives alone.  Her daughter [name] next-door and her other daughter lives close by as well.   Current Medications: Outpatient Encounter Medications as of 02/24/2021  Medication Sig   acetaminophen (TYLENOL) 325 MG tablet Take 2 tablets (650 mg total) by mouth every 6 (six) hours as needed for mild pain (or temp > 100).   amLODipine (NORVASC) 2.5 MG tablet Take 1 tablet (2.5 mg total) by mouth daily. For BP   atorvastatin (LIPITOR) 20 MG tablet Take 20 mg by mouth every evening.    levothyroxine (SYNTHROID) 88 MCG tablet Take 88 mcg by mouth every morning.   HYDROcodone-acetaminophen (NORCO/VICODIN) 5-325 MG tablet Take 0.5-1 tablets by mouth every 6 (six) hours as needed for moderate pain. (Patient not taking: No sig reported)   ondansetron (ZOFRAN) 4 MG tablet Take 1 tablet (4 mg total) by mouth every 6 (six) hours. (Patient not taking: No sig reported)   prochlorperazine (COMPAZINE) 10 MG tablet Take 1 tablet (10 mg total) by mouth every 8 (eight) hours as needed for nausea or vomiting. (Patient not taking: No sig reported)   No facility-administered encounter medications on file as of 02/24/2021.   Past medical history  Hypertension. Hypothyroidism of at least 20 years duration Hyperlipidemia. Osteoporosis.  Olivia Haas previously has been on Prolia.  Olivia Haas suffered a T12 fracture when Olivia Haas fell on 01/06/2021. He had fracture to right fibula about 20 years ago resulting from a fall. Olivia Haas had screening colonoscopy 5 or 6 years ago by Dr. Doristine Mango and was normal. Hysterectomy 1977. Cholecystectomy for symptomatic cholelithiasis in July 2021.  Allergies  Allergies  Allergen Reactions   Cucumber Extract Other (See Comments)    Vomiting   Strawberry (Diagnostic) Other (See Comments)    Vomiting      Family history  Father died of MI at age 36.  Mother lived to be 74.  Olivia Haas  suffered CVA.  Olivia Haas has 1 sister living.  Olivia Haas is 46 years old and has A. fib.  Other sister was diagnosed with pancreatic carcinoma at age 59 and died in less than a year.  Social history  Olivia Haas is widowed.  Olivia Haas lost her husband in 2014 because of dementia.  Olivia Haas works at a [employer] in a [employer] for 40 years and retired in 2003.  Olivia Haas has never smoked cigarettes and does not drink alcohol.  Olivia Haas has 2 grownup daughters.  1 daughter 9Debbie) has celiac disease.  Physical examination  Blood pressure 126/85, pulse 87, temperature 97.9 F (36.6 C), temperature source Oral, height 5' (1.524 m), weight 114 lb 8 oz (51.9 kg). Patient is alert and in no acute distress. Olivia Haas appears younger than stated age. Olivia Haas is wearing a mask. Conjunctiva is pink. Sclera is nonicteric Oropharyngeal mucosa is normal. No neck masses or thyromegaly noted. Cardiac exam with regular rhythm normal S1 and S2. No murmur or gallop noted. Lungs are clear to auscultation. Abdomen abdomen is symmetrical.  Low midline scar noted.  Bowels are normal.  No bruit noted.  On palpation abdomen is soft and nontender with organomegaly or masses. No LE edema or clubbing noted.  Labs/studies Results:   CBC Latest Ref Rng & Units 01/06/2021 02/16/2020 02/15/2020  WBC 4.0 - 10.5 K/uL 13.7(H) 9.6 12.9(H)  Hemoglobin 12.0 - 15.0 g/dL 15.8(H) 13.2 14.5  Hematocrit 36.0 - 46.0 % 46.7(H) 39.4 42.8  Platelets 150 - 400 K/uL 308 274 339    CMP Latest Ref Rng & Units 01/06/2021 02/16/2020 02/15/2020  Glucose 70 - 99 mg/dL 133(H) 113(H) 156(H)  BUN 8 - 23 mg/dL 10 7(L) 7(L)  Creatinine 0.44 - 1.00 mg/dL 0.64 0.51 0.53  Sodium 135 - 145 mmol/L 135 131(L) 127(L)  Potassium 3.5 - 5.1 mmol/L 3.4(L) 3.6 3.2(L)  Chloride 98 - 111 mmol/L 102 102 94(L)  CO2 22 - 32 mmol/L 23 19(L) 21(L)  Calcium 8.9 - 10.3 mg/dL 9.8 8.6(L) 9.0  Total Protein 6.5 - 8.1 g/dL 8.4(H) - 7.0  Total Bilirubin 0.3 - 1.2 mg/dL 1.2 - 1.3(H)  Alkaline Phos 38 - 126 U/L 141(H) - 91   AST 15 - 41 U/L 29 - 41  ALT 0 - 44 U/L 23 - 43    Hepatic Function Latest Ref Rng & Units 01/06/2021 02/15/2020 02/13/2020  Total Protein 6.5 - 8.1 g/dL 8.4(H) 7.0 7.0  Albumin 3.5 - 5.0 g/dL 5.1(H) 4.2 4.4  AST 15 - 41 U/L 29 41 21  ALT 0 - 44 U/L 23 43 20  Alk Phosphatase 38 - 126 U/L 141(H) 91 93  Total Bilirubin 0.3 - 1.2 mg/dL 1.2 1.3(H) 1.1    Lab data from 01/06/2021 reviewed and compared with prior blood work. This blood work was done when Olivia Haas was seen in the emergency room after suffering a fall at home.  Assessment:  #1.  Weight loss.  Olivia Haas weighed 124 pounds about 3 months ago.  Therefore Olivia Haas has lost 3 pounds.  Weight loss appears to be associated with onset of fracture resulting in pain nausea and vomiting.  All of the symptoms have resolved.  Patient reports having gained 3 pounds since he has been drinking supplement.  Her abdominal examination is benign.  Olivia Haas certainly could have peptic ulcer disease.  Olivia Haas is status postcholecystectomy 1 year ago. It would be reasonable to treat her with histamine 2 blocker i.e. famotidine and see how Olivia Haas does over the next 1 month.  #2.  Intermittent postprandial diarrhea since cholecystectomy.  Olivia Haas can use loperamide OTC 2 mg daily as needed.  No further work-up needed at this time.  #23  Mildly elevated alkaline phosphatase with normal transaminases.  Source would appear to be bone given history of fracture.  LFTs will be repeated today.  #4.  History of fluctuating blood pressure and an intermittent heaviness in left arm.  Cardiology appointment is not until 2 months.  Will contact Dr. Harl Bowie and request earlier visit.    Recommendations  Patient will go to the lab for CBC, comprehensive chemistry panel and sed rate. Begin famotidine 40 mg by mouth daily at bedtime. Patient advised to take Tylenol before physical activity in order to minimize suffering.  Olivia Haas can take up to 2 g/day in divided dose. Medication list updated.  Compazine  and hydrocodone were deleted.  Patient advised to take ondansetron for nausea. Patient to call office if Olivia Haas has another episode of nausea or vomiting. Office visit in 1 month.

## 2021-02-24 NOTE — Patient Instructions (Signed)
Notify if you have another episode of nausea and vomiting. Patient will call with results of blood test when completed.

## 2021-02-25 ENCOUNTER — Other Ambulatory Visit (HOSPITAL_COMMUNITY): Payer: Self-pay | Admitting: Internal Medicine

## 2021-02-25 LAB — CBC WITH DIFFERENTIAL/PLATELET
Absolute Monocytes: 733 cells/uL (ref 200–950)
Basophils Absolute: 59 cells/uL (ref 0–200)
Basophils Relative: 0.8 %
Eosinophils Absolute: 385 cells/uL (ref 15–500)
Eosinophils Relative: 5.2 %
HCT: 39.5 % (ref 35.0–45.0)
Hemoglobin: 12.9 g/dL (ref 11.7–15.5)
Lymphs Abs: 2568 cells/uL (ref 850–3900)
MCH: 30.1 pg (ref 27.0–33.0)
MCHC: 32.7 g/dL (ref 32.0–36.0)
MCV: 92.3 fL (ref 80.0–100.0)
MPV: 10.1 fL (ref 7.5–12.5)
Monocytes Relative: 9.9 %
Neutro Abs: 3656 cells/uL (ref 1500–7800)
Neutrophils Relative %: 49.4 %
Platelets: 319 10*3/uL (ref 140–400)
RBC: 4.28 10*6/uL (ref 3.80–5.10)
RDW: 13.6 % (ref 11.0–15.0)
Total Lymphocyte: 34.7 %
WBC: 7.4 10*3/uL (ref 3.8–10.8)

## 2021-02-25 LAB — COMPREHENSIVE METABOLIC PANEL
AG Ratio: 1.9 (calc) (ref 1.0–2.5)
ALT: 12 U/L (ref 6–29)
AST: 14 U/L (ref 10–35)
Albumin: 4.4 g/dL (ref 3.6–5.1)
Alkaline phosphatase (APISO): 100 U/L (ref 37–153)
BUN: 16 mg/dL (ref 7–25)
CO2: 25 mmol/L (ref 20–32)
Calcium: 10 mg/dL (ref 8.6–10.4)
Chloride: 105 mmol/L (ref 98–110)
Creat: 0.73 mg/dL (ref 0.60–0.95)
Globulin: 2.3 g/dL (calc) (ref 1.9–3.7)
Glucose, Bld: 101 mg/dL (ref 65–139)
Potassium: 4.6 mmol/L (ref 3.5–5.3)
Sodium: 139 mmol/L (ref 135–146)
Total Bilirubin: 0.3 mg/dL (ref 0.2–1.2)
Total Protein: 6.7 g/dL (ref 6.1–8.1)

## 2021-02-25 LAB — SEDIMENTATION RATE: Sed Rate: 2 mm/h (ref 0–30)

## 2021-03-03 ENCOUNTER — Encounter: Payer: Self-pay | Admitting: Cardiology

## 2021-03-03 ENCOUNTER — Ambulatory Visit (INDEPENDENT_AMBULATORY_CARE_PROVIDER_SITE_OTHER): Payer: Medicare Other | Admitting: Cardiology

## 2021-03-03 ENCOUNTER — Encounter: Payer: Self-pay | Admitting: *Deleted

## 2021-03-03 ENCOUNTER — Ambulatory Visit (INDEPENDENT_AMBULATORY_CARE_PROVIDER_SITE_OTHER): Payer: Medicare Other | Admitting: Internal Medicine

## 2021-03-03 VITALS — BP 126/70 | HR 84 | Ht 60.0 in | Wt 113.2 lb

## 2021-03-03 DIAGNOSIS — I10 Essential (primary) hypertension: Secondary | ICD-10-CM | POA: Diagnosis not present

## 2021-03-03 DIAGNOSIS — R0789 Other chest pain: Secondary | ICD-10-CM | POA: Diagnosis not present

## 2021-03-03 MED ORDER — AMLODIPINE BESYLATE 2.5 MG PO TABS: 2.5000 mg | ORAL_TABLET | Freq: Two times a day (BID) | ORAL | 6 refills | Status: DC

## 2021-03-03 NOTE — Patient Instructions (Signed)
Medication Instructions:  Increase Norvasc to 2.5mg  twice a day. Continue all other medications.    Labwork: none  Testing/Procedures: Your physician has requested that you have a lexiscan myoview. For further information please visit [email or web address]. Please follow instruction sheet, as given. Office will contact with results via phone or letter.    Follow-Up: 3 months   Any Other Special Instructions Will Be Listed Below (If Applicable).  If you need a refill on your cardiac medications before your next appointment, please call your pharmacy.

## 2021-03-03 NOTE — Progress Notes (Signed)
Clinical Summary Olivia Haas is a 87 y.o.female seen today as a new consult, referred by Dr Sherrie Sport for the following medical problems.   1.Chest pain - left arm shooting pain. Can have some SOB at times but not specifically associated with arm pain.  - pain can last a few hours. Some improvement with position. Occurs 3-4 times a week - prior to recent fall was very physically, now more sedentary - can some DOE with short distances   CAD risk factors: HTN, hyperlipidemia, father MI 60     2. HTN - new diagnosis of HTN - has been on norasv 2.5mg  daily. Home bp's 200s/90, this was off her norvasc - home bp's 110-160s/70s, avg 140s/70s   3. Weight loss - ongoing workup per GI   Past Medical History:  Diagnosis Date   High cholesterol    Thyroid disease      Allergies  Allergen Reactions   Cucumber Extract Other (See Comments)    Vomiting   Strawberry (Diagnostic) Other (See Comments)    Vomiting      Current Outpatient Medications  Medication Sig Dispense Refill   acetaminophen (TYLENOL) 325 MG tablet Take 2 tablets (650 mg total) by mouth every 6 (six) hours as needed for mild pain (or temp > 100). 12 tablet 0   amLODipine (NORVASC) 2.5 MG tablet Take 1 tablet (2.5 mg total) by mouth daily. For BP 30 tablet 0   atorvastatin (LIPITOR) 20 MG tablet Take 20 mg by mouth every evening.      famotidine (PEPCID) 40 MG tablet Take 1 tablet (40 mg total) by mouth at bedtime. 30 tablet 5   levothyroxine (SYNTHROID) 88 MCG tablet Take 88 mcg by mouth every morning.     ondansetron (ZOFRAN) 4 MG tablet Take 1 tablet (4 mg total) by mouth every 6 (six) hours. (Patient not taking: No sig reported) 12 tablet 0   No current facility-administered medications for this visit.     Past Surgical History:  Procedure Laterality Date   ABDOMINAL HYSTERECTOMY     ANKLE SURGERY Right 2004   CHOLECYSTECTOMY N/A 02/13/2020   Procedure: LAPAROSCOPIC CHOLECYSTECTOMY;  Surgeon:  Virl Cagey, MD;  Location: [facility];  Service: General;  Laterality: N/A;   TONSILLECTOMY       Allergies  Allergen Reactions   Cucumber Extract Other (See Comments)    Vomiting   Strawberry (Diagnostic) Other (See Comments)    Vomiting       Family History  Problem Relation Age of Onset   Heart attack Father    Anesthesia problems Daughter        Sensitive to medication/ waking up      Social History Ms. Lame reports that she has never smoked. She has never used smokeless tobacco. Ms. Remus reports no history of alcohol use.   Review of Systems CONSTITUTIONAL: No weight loss, fever, chills, weakness or fatigue.  HEENT: Eyes: No visual loss, blurred vision, double vision or yellow sclerae.No hearing loss, sneezing, congestion, runny nose or sore throat.  SKIN: No rash or itching.  CARDIOVASCULAR: per hpi RESPIRATORY: No shortness of breath, cough or sputum.  GASTROINTESTINAL: No anorexia, nausea, vomiting or diarrhea. No abdominal pain or blood.  GENITOURINARY: No burning on urination, no polyuria NEUROLOGICAL: No headache, dizziness, syncope, paralysis, ataxia, numbness or tingling in the extremities. No change in bowel or bladder control.  MUSCULOSKELETAL: No muscle, back pain, joint pain or stiffness.  LYMPHATICS: No enlarged nodes. No  history of splenectomy.  PSYCHIATRIC: No history of depression or anxiety.  ENDOCRINOLOGIC: No reports of sweating, cold or heat intolerance. No polyuria or polydipsia.  Marland Kitchen   Physical Examination Today's Vitals   03/03/21 1525  BP: 126/70  Pulse: 84  SpO2: 98%  Weight: 113 lb 3.2 oz (51.3 kg)  Height: 5' (1.524 m)   Body mass index is 22.11 kg/m.  Gen: resting comfortably, no acute distress HEENT: no scleral icterus, pupils equal round and reactive, no palptable cervical adenopathy,  CV: RRR, no m/r/g, no jvd Resp: Clear to auscultation bilaterally GI: abdomen is soft, non-tender, non-distended, normal bowel  sounds, no hepatosplenomegaly MSK: extremities are warm, no edema.  Skin: warm, no rash Neuro:  no focal deficits Psych: appropriate affect      Assessment and Plan  Chest pain/left arm pain -somewhat atypical symptoms but also with some recent DOE - will plan for a lexiscan to further evaluate - EKG today SR, no acute ischemic changes  2. HTN - somewhat labile, overall pressures above goal - try norvasc 2.5mg  bid and monitor bp    F/u 3 months  Arnoldo Lenis, M.D.

## 2021-03-10 ENCOUNTER — Encounter (HOSPITAL_BASED_OUTPATIENT_CLINIC_OR_DEPARTMENT_OTHER)
Admission: RE | Admit: 2021-03-10 | Discharge: 2021-03-10 | Disposition: A | Discharge status: Home / Self Care | Payer: Medicare Other | Source: Ambulatory Visit | Attending: Cardiology | Admitting: Cardiology

## 2021-03-10 ENCOUNTER — Encounter (HOSPITAL_COMMUNITY)
Admission: RE | Admit: 2021-03-10 | Discharge: 2021-03-10 | Disposition: A | Discharge status: Home / Self Care | Payer: Medicare Other | Source: Ambulatory Visit | Attending: Cardiology | Admitting: Cardiology

## 2021-03-10 ENCOUNTER — Encounter (HOSPITAL_COMMUNITY): Payer: Self-pay

## 2021-03-10 ENCOUNTER — Other Ambulatory Visit: Payer: Self-pay

## 2021-03-10 DIAGNOSIS — R0789 Other chest pain: Secondary | ICD-10-CM | POA: Diagnosis not present

## 2021-03-10 HISTORY — DX: Essential (primary) hypertension: I10

## 2021-03-10 LAB — NM MYOCAR MULTI W/SPECT W/WALL MOTION / EF
LV dias vol: 36 mL (ref 46–106)
LV sys vol: 6 mL
Peak HR: 102 {beats}/min
RATE: 0.31
Rest HR: 66 {beats}/min
SDS: 3
SRS: 6
SSS: 9
TID: 0.84

## 2021-03-10 MED ORDER — SODIUM CHLORIDE FLUSH 0.9 % IV SOLN
INTRAVENOUS | Status: AC
  Administered 2021-03-10: 10 mL via INTRAVENOUS
  Filled 2021-03-10: qty 10

## 2021-03-10 MED ORDER — TECHNETIUM TC 99M TETROFOSMIN IV KIT
10.0000 | PACK | Freq: Once | INTRAVENOUS | Status: AC | PRN
  Administered 2021-03-10: 10.9 via INTRAVENOUS

## 2021-03-10 MED ORDER — TECHNETIUM TC 99M TETROFOSMIN IV KIT
30.0000 | PACK | Freq: Once | INTRAVENOUS | Status: AC | PRN
  Administered 2021-03-10: 32 via INTRAVENOUS

## 2021-03-10 MED ORDER — REGADENOSON 0.4 MG/5ML IV SOLN
INTRAVENOUS | Status: AC
  Administered 2021-03-10: 0.4 mg via INTRAVENOUS
  Filled 2021-03-10: qty 5

## 2021-03-11 ENCOUNTER — Telehealth: Payer: Self-pay | Admitting: *Deleted

## 2021-03-11 NOTE — Telephone Encounter (Signed)
-----   Message from Arnoldo Lenis, MD sent at 03/11/2021 10:37 AM EDT ----- Normal stress test, no evidence of any blockages in her heart as the cause of her symptoms   Zandra Abts MD

## 2021-03-11 NOTE — Telephone Encounter (Signed)
Laurine Blazer, LPN  [id number] PM EDT Back to Top     Notified, copy to pcp.

## 2021-03-15 ENCOUNTER — Encounter (HOSPITAL_COMMUNITY): Payer: Self-pay

## 2021-03-15 ENCOUNTER — Ambulatory Visit (HOSPITAL_COMMUNITY): Payer: Medicare Other | Attending: Orthopedic Surgery

## 2021-03-15 ENCOUNTER — Other Ambulatory Visit: Payer: Self-pay

## 2021-03-15 DIAGNOSIS — M545 Low back pain, unspecified: Secondary | ICD-10-CM | POA: Insufficient documentation

## 2021-03-15 DIAGNOSIS — M6281 Muscle weakness (generalized): Secondary | ICD-10-CM | POA: Diagnosis not present

## 2021-03-15 DIAGNOSIS — R29898 Other symptoms and signs involving the musculoskeletal system: Secondary | ICD-10-CM | POA: Insufficient documentation

## 2021-03-15 DIAGNOSIS — G8929 Other chronic pain: Secondary | ICD-10-CM | POA: Diagnosis not present

## 2021-03-15 DIAGNOSIS — R2681 Unsteadiness on feet: Secondary | ICD-10-CM | POA: Insufficient documentation

## 2021-03-15 NOTE — Therapy (Signed)
Olivia Haas, [place], [address] Phone: [phone]   Fax:  [phone]  Physical Therapy Evaluation  Patient Details  Name: Olivia Haas MRN: [id number] Date of Birth: April 02, 1933 Referring Provider (PT): Mordecai Rasmussen, MD   Encounter Date: 03/15/2021   PT End of Session - 03/15/21 1116     Visit Number 1    Number of Visits 6    Date for PT Re-Evaluation 04/26/21    Authorization Type Medicare    PT Start Time 1115    PT Stop Time 1200    PT Time Calculation (min) 45 min    Activity Tolerance Patient tolerated treatment well             Past Medical History:  Diagnosis Date   High cholesterol    Hypertension    Thyroid disease     Past Surgical History:  Procedure Laterality Date   ABDOMINAL HYSTERECTOMY     ANKLE SURGERY Right 2004   CHOLECYSTECTOMY N/A 02/13/2020   Procedure: LAPAROSCOPIC CHOLECYSTECTOMY;  Surgeon: Virl Cagey, MD;  Location: [facility];  Service: General;  Laterality: N/A;   TONSILLECTOMY      There were no vitals filed for this visit.    Subjective Assessment - 03/15/21 1119     Subjective Pt had a fall in 3rd week of May which resulted in T12 compression fracture which has been treated non-surgically at this time. Pt notes her symptoms have improved since the initial injury but notes continued pain and limited activity since this injury    Limitations Lifting;Standing;Walking;House hold activities    How long can you sit comfortably? 1 hour    How long can you walk comfortably? 10-15 min    Currently in Pain? Yes    Pain Score 4     Pain Location Back    Pain Orientation Right;Lower    Pain Descriptors / Indicators Sore    Pain Type Chronic pain    Pain Onset More than a month ago    Pain Frequency Intermittent    Aggravating Factors  lifting, prolonged standing, walking    Pain Relieving Factors sitting with cold pack,    Effect of Pain on Daily Activities  limits all activities                [facility] PT Assessment - 03/15/21 0001       Assessment   Medical Diagnosis T12 compression fx    Referring Provider (PT) Mordecai Rasmussen, MD      Balance Screen   Has the patient fallen in the past 6 months Yes    How many times? 1    Has the patient had a decrease in activity level because of a fear of falling?  Yes    Is the patient reluctant to leave their home because of a fear of falling?  No      Home Environment   Living Environment Private residence    Living Arrangements Alone    Available Help at Discharge Family    Type of [place] to enter    [place] None      Prior Function   Level of [place] Retired    Leisure yard work      Observation/Other Assessments   Focus on [place])  34% function  ROM / Strength   AROM / PROM / Strength AROM;Strength      AROM   AROM Assessment Site Lumbar    Lumbar Flexion 25% limited    Lumbar Extension 25% limited      Strength   Overall Strength Comments BLE 5/5, trunk not tested      Palpation   Palpation comment pain in right lower lumbar      Ambulation/Gait   Ambulation/Gait Yes    Ambulation/Gait Assistance 7: Independent    Ambulation Distance (Feet) 250 Feet    Assistive device None    Gait Pattern Within Functional Limits    Ambulation Surface Level;Indoor    Gait Comments 2MWT      Balance   Balance Assessed Yes      Standardized Balance Assessment   Standardized Balance Assessment --   SLS: 4-5 sec                       Objective measurements completed on examination: See above findings.       [facility] Adult PT Treatment/Exercise - 03/15/21 0001       Exercises   Exercises Lumbar      Lumbar Exercises: Supine   Ab Set 10 reps;2 seconds    Isometric Hip Flexion 10 reps;2 seconds                    PT Education -  03/15/21 1313     Education Details pt education in mechanics of compression fracture, lifting with good form/posture to prevent excessive trunk flexion/extension    Person(s) Educated Patient    Methods Explanation;Demonstration;Handout    Comprehension Need further instruction              PT Short Term Goals - 03/15/21 1317       PT SHORT TERM GOAL #1   Title Patient will be independent with HEP in order to improve functional outcomes.    Time 3    Period Weeks    Status New    Target Date 04/05/21      PT SHORT TERM GOAL #2   Title Patient will report at least 25% improvement in symptoms for improved quality of life.    Baseline 4/10 general low back pain    Time 3    Period Weeks    Status New    Target Date 04/05/21      PT SHORT TERM GOAL #3   Title Demo proper lifting techniuqes of various sized objects to improve body mechanics and reduce risk for re-injury to back    Baseline poor mechanics demonstrated    Time 3    Period Weeks    Status New    Target Date 04/05/21               PT Long Term Goals - 03/15/21 1320       PT LONG TERM GOAL #1   Title Patient will be able to ambulate at least 350 feet in 2MWT in order to demonstrate improved gait speed for community ambulation    Baseline 250    Time 6    Period Weeks    Status New    Target Date 04/26/21      PT LONG TERM GOAL #2   Title Patient will report at least 50% improvement in symptoms for improved quality of life.    Baseline 4/10 general back pain    Time 6  Period Weeks    Status New    Target Date 04/26/21      PT LONG TERM GOAL #3   Title Demo improved balance as evidenced by 15 sec single leg stance    Baseline 3-5 sec    Time 6    Period Weeks    Status New    Target Date 04/26/21                    Plan - 03/15/21 1314     Clinical Impression Statement Patient is a 87 yo lady presenting to physical therapy with c/o right LBP. She presents with pain  limited deficits in lumbar strength, ROM, endurance, postural impairments, spinal mobility and functional mobility with ADL. She is having to modify and restrict ADL as indicated by FOTO score as well as subjective information and objective measures which is affecting overall participation. Patient will benefit from skilled physical therapy in order to improve function and reduce impairment.    Personal Factors and Comorbidities Age;Time since onset of injury/illness/exacerbation    Examination-Activity Limitations Bend;Carry;Lift;Locomotion Level;Reach Overhead;Squat;Transfers    Examination-Participation Restrictions Cleaning;Community Activity;Driving;Yard Work;Meal Prep    Stability/Clinical Decision Making Stable/Uncomplicated    Clinical Decision Making Low    Rehab Potential Good    PT Frequency 1x / week    PT Duration 6 weeks    PT Treatment/Interventions ADLs/Self Care Home Management;Aquatic Therapy;Gait training;Stair training;Functional mobility training;Therapeutic activities;Therapeutic exercise;Balance training;Patient/family education;Neuromuscular re-education;Manual techniques;Taping    PT Next Visit Plan core stabilization    PT Home Exercise Plan ab set, hip flexion isometric    Consulted and Agree with Plan of Care Patient             Patient will benefit from skilled therapeutic intervention in order to improve the following deficits and impairments:  Decreased activity tolerance, Decreased mobility, Decreased endurance, Decreased range of motion, Decreased strength, Postural dysfunction, Improper body mechanics, Pain  Visit Diagnosis: Chronic right-sided low back pain without sciatica  Muscle weakness (generalized)  Other symptoms and signs involving the musculoskeletal system  Unsteadiness on feet     Problem List Patient Active Problem List   Diagnosis Date Noted   Weight loss 02/24/2021   Alkaline phosphatase elevation 02/24/2021   Shingles rash- Rt  Flank 02/16/2020   Elevated BP without diagnosis of hypertension 02/15/2020   Hyponatremia 02/15/2020   Hypokalemia 02/15/2020   Symptomatic cholelithiasis--status post lap chole 02/13/20 02/12/2020   N&V (nausea and vomiting) Q000111Q   Biliary colic Q000111Q   History of lumbar laminectomy for spinal cord decompression 12/04/2019   1:24 PM, 03/15/21 M. Sherlyn Lees, PT, DPT Physical Therapist- [place] Office Number: [phone]   [facility] [address] [place], [place], [address] Phone: [phone]   Fax:  [phone]  Name: Olivia Haas MRN: [id number] Date of Birth: 1933/03/27

## 2021-03-15 NOTE — Patient Instructions (Signed)
Access Code: [email or web address] URL: [email or web address] Date: 03/15/2021 Prepared by: Sherlyn Lees  Exercises Abdominal Bracing - 1-3 x daily - 7 x weekly - 3 sets - 10 reps - 2 sec hold Hooklying Isometric Hip Flexion - 1-3 x daily - 7 x weekly - 3 sets - 10 reps - 2 sec hold

## 2021-03-21 ENCOUNTER — Ambulatory Visit (HOSPITAL_COMMUNITY): Payer: Medicare Other | Admitting: Physical Therapy

## 2021-03-21 ENCOUNTER — Other Ambulatory Visit: Payer: Self-pay

## 2021-03-21 ENCOUNTER — Encounter (HOSPITAL_COMMUNITY): Payer: Self-pay | Admitting: Physical Therapy

## 2021-03-21 DIAGNOSIS — M545 Low back pain, unspecified: Secondary | ICD-10-CM

## 2021-03-21 DIAGNOSIS — R2681 Unsteadiness on feet: Secondary | ICD-10-CM

## 2021-03-21 DIAGNOSIS — G8929 Other chronic pain: Secondary | ICD-10-CM | POA: Diagnosis not present

## 2021-03-21 DIAGNOSIS — M6281 Muscle weakness (generalized): Secondary | ICD-10-CM

## 2021-03-21 DIAGNOSIS — R29898 Other symptoms and signs involving the musculoskeletal system: Secondary | ICD-10-CM | POA: Diagnosis not present

## 2021-03-22 NOTE — Therapy (Signed)
[place], [place], [address] Phone: [phone]   Fax:  [phone]  Physical Therapy Treatment  Patient Details  Name: Olivia Haas MRN: [id number] Date of Birth: 1932/12/30 Referring Provider (PT): Mordecai Rasmussen, MD   Encounter Date: 03/21/2021   PT End of Session - 03/22/21 1008     Visit Number 2    Number of Visits 6    Date for PT Re-Evaluation 04/26/21    Authorization Type Medicare    PT Start Time 1645    PT Stop Time 1723    PT Time Calculation (min) 38 min    Activity Tolerance Patient tolerated treatment well             Past Medical History:  Diagnosis Date   High cholesterol    Hypertension    Thyroid disease     Past Surgical History:  Procedure Laterality Date   ABDOMINAL HYSTERECTOMY     ANKLE SURGERY Right 2004   CHOLECYSTECTOMY N/A 02/13/2020   Procedure: LAPAROSCOPIC CHOLECYSTECTOMY;  Surgeon: Virl Cagey, MD;  Location: [facility];  Service: General;  Laterality: N/A;   TONSILLECTOMY      There were no vitals filed for this visit.   Subjective Assessment - 03/21/21 1649     Subjective reports 4/10 pain and that she's feeling better.    Limitations Lifting;Standing;Walking;House hold activities    How long can you sit comfortably? 1 hour    How long can you walk comfortably? 10-15 min    Pain Onset More than a month ago                               [facility] Adult PT Treatment/Exercise - 03/22/21 0001       Bed Mobility   Bed Mobility Supine to Sit      Lumbar Exercises: Standing   Other Standing Lumbar Exercises overhead ball touches 6x3      Lumbar Exercises: Seated   Long Arc Quad on Chair Both   8 reps   Other Seated Lumbar Exercises overhead reaches with ball x15. marching x8 B      Lumbar Exercises: Supine   Bridge 10 reps   8 reps   Other Supine Lumbar Exercises marches x10 B                    PT Education -  03/22/21 1007     Education Details listening to body vs pushing through, how to adjust exercises to allow for improved function.    Person(s) Educated Patient    Methods Explanation;Demonstration;Handout    Comprehension Verbalized understanding;Returned demonstration              PT Short Term Goals - 03/15/21 1317       PT SHORT TERM GOAL #1   Title Patient will be independent with HEP in order to improve functional outcomes.    Time 3    Period Weeks    Status New    Target Date 04/05/21      PT SHORT TERM GOAL #2   Title Patient will report at least 25% improvement in symptoms for improved quality of life.    Baseline 4/10 general low back pain    Time 3    Period Weeks    Status New    Target Date 04/05/21      PT SHORT TERM  GOAL #3   Title Demo proper lifting techniuqes of various sized objects to improve body mechanics and reduce risk for re-injury to back    Baseline poor mechanics demonstrated    Time 3    Period Weeks    Status New    Target Date 04/05/21               PT Long Term Goals - 03/15/21 1320       PT LONG TERM GOAL #1   Title Patient will be able to ambulate at least 350 feet in 2MWT in order to demonstrate improved gait speed for community ambulation    Baseline 250    Time 6    Period Weeks    Status New    Target Date 04/26/21      PT LONG TERM GOAL #2   Title Patient will report at least 50% improvement in symptoms for improved quality of life.    Baseline 4/10 general back pain    Time 6    Period Weeks    Status New    Target Date 04/26/21      PT LONG TERM GOAL #3   Title Demo improved balance as evidenced by 15 sec single leg stance    Baseline 3-5 sec    Time 6    Period Weeks    Status New    Target Date 04/26/21                   Plan - 03/22/21 1010     Clinical Impression Statement Good participation throughout session and body awareness allowing for improved coordination and participation in  each PT activity. Given majority of activities as HEP secondary to long duration between current PT visit and next, but reinforced not needing to complete all activities each day. Good improvement in core activation during supine marches and UE raises allowing for upper and lower core activation throughout. Cont address strength and mobility to allow for improved completion of ADLs.    Personal Factors and Comorbidities Age;Time since onset of injury/illness/exacerbation    Examination-Activity Limitations Bend;Carry;Lift;Locomotion Level;Reach Overhead;Squat;Transfers    Examination-Participation Restrictions Cleaning;Community Activity;Driving;Yard Work;Meal Prep    Stability/Clinical Decision Making Stable/Uncomplicated    Rehab Potential Good    PT Frequency 1x / week    PT Duration 6 weeks    PT Treatment/Interventions ADLs/Self Care Home Management;Aquatic Therapy;Gait training;Stair training;Functional mobility training;Therapeutic activities;Therapeutic exercise;Balance training;Patient/family education;Neuromuscular re-education;Manual techniques;Taping    PT Next Visit Plan core stabilization    PT Home Exercise Plan ab set, hip flexion isometric 8/8: supine and sitting marches, bridges, overhead reach, overhead ball taps, LAQ.    Consulted and Agree with Plan of Care Patient             Patient will benefit from skilled therapeutic intervention in order to improve the following deficits and impairments:  Decreased activity tolerance, Decreased mobility, Decreased endurance, Decreased range of motion, Decreased strength, Postural dysfunction, Improper body mechanics, Pain  Visit Diagnosis: Chronic right-sided low back pain without sciatica  Muscle weakness (generalized)  Other symptoms and signs involving the musculoskeletal system  Unsteadiness on feet     Problem List Patient Active Problem List   Diagnosis Date Noted   Weight loss 02/24/2021   Alkaline phosphatase  elevation 02/24/2021   Shingles rash- Rt Flank 02/16/2020   Elevated BP without diagnosis of hypertension 02/15/2020   Hyponatremia 02/15/2020   Hypokalemia 02/15/2020   Symptomatic cholelithiasis--status post lap chole  02/13/20 02/12/2020   N&V (nausea and vomiting) Q000111Q   Biliary colic Q000111Q   History of lumbar laminectomy for spinal cord decompression 12/04/2019    10:15 AM,03/22/21 Domenic Moras, PT, DPT Physical Therapist at [place], [place], [address] Phone: [phone]   Fax:  [phone]  Name: Olivia Haas MRN: [id number] Date of Birth: [birth year]

## 2021-03-31 ENCOUNTER — Encounter (HOSPITAL_COMMUNITY): Payer: Self-pay

## 2021-03-31 ENCOUNTER — Other Ambulatory Visit: Payer: Self-pay

## 2021-03-31 ENCOUNTER — Ambulatory Visit (HOSPITAL_COMMUNITY): Payer: Medicare Other

## 2021-03-31 DIAGNOSIS — G8929 Other chronic pain: Secondary | ICD-10-CM | POA: Diagnosis not present

## 2021-03-31 DIAGNOSIS — M545 Low back pain, unspecified: Secondary | ICD-10-CM

## 2021-03-31 DIAGNOSIS — R2681 Unsteadiness on feet: Secondary | ICD-10-CM

## 2021-03-31 DIAGNOSIS — M6281 Muscle weakness (generalized): Secondary | ICD-10-CM

## 2021-03-31 DIAGNOSIS — R29898 Other symptoms and signs involving the musculoskeletal system: Secondary | ICD-10-CM | POA: Diagnosis not present

## 2021-03-31 NOTE — Therapy (Signed)
[place], [place], [address] Phone: [phone]   Fax:  [phone]  Physical Therapy Treatment  Patient Details  Name: Olivia Haas MRN: [id number] Date of Birth: 08-29-1932 Referring Provider (PT): Mordecai Rasmussen, MD   Encounter Date: 03/31/2021   PT End of Session - 03/31/21 0952     Visit Number 3    Number of Visits 6    Date for PT Re-Evaluation 04/26/21    Authorization Type Medicare    PT Start Time 1006    PT Stop Time 1048    PT Time Calculation (min) 42 min    Activity Tolerance Patient tolerated treatment well             Past Medical History:  Diagnosis Date   High cholesterol    Hypertension    Thyroid disease     Past Surgical History:  Procedure Laterality Date   ABDOMINAL HYSTERECTOMY     ANKLE SURGERY Right 2004   CHOLECYSTECTOMY N/A 02/13/2020   Procedure: LAPAROSCOPIC CHOLECYSTECTOMY;  Surgeon: Virl Cagey, MD;  Location: [facility];  Service: General;  Laterality: N/A;   TONSILLECTOMY      There were no vitals filed for this visit.   Subjective Assessment - 03/31/21 0951     Subjective Driving causes more pain. 6/10 pain now but has to stand to decrease pain. Has been doing HEP 1x/day. Listening to body more.    Limitations Lifting;Standing;Walking;House hold activities    How long can you sit comfortably? 1 hour    How long can you walk comfortably? 10-15 min    Pain Onset More than a month ago                [facility] Adult PT Treatment/Exercise - 03/31/21 0001       Bed Mobility   Bed Mobility Rolling Left;Left Sidelying to Sit   5 reps   Rolling Left Set up assist    Left Sidelying to Sit Supervision/Verbal cueing      Lumbar Exercises: Standing   Other Standing Lumbar Exercises SLS 10 sec x2 B; tandem 30 secx2 B      Lumbar Exercises: Seated   Long Arc Quad on Chair Both   reps   LAQ on Chair Weights (lbs) 1 pound    Other Seated Lumbar Exercises  overhead reaches with ball x15; 1# weight in each hand x10. marching x10 B 1# weight      Lumbar Exercises: Supine   Bridge Non-compliant;10 reps;5 seconds    Other Supine Lumbar Exercises marches x10 B 1# ankle weight             PT Education - 03/31/21 1149     Education Details Discussed purpose and technique of interventions throughout session. Log roll. Body mechanics during HEP.    Person(s) Educated Patient    Methods Explanation    Comprehension Verbalized understanding;Returned demonstration              PT Short Term Goals - 03/31/21 0953       PT SHORT TERM GOAL #1   Title Patient will be independent with HEP in order to improve functional outcomes.    Time 3    Period Weeks    Status Achieved    Target Date 04/05/21      PT SHORT TERM GOAL #2   Title Patient will report at least 25% improvement in symptoms for improved quality of life.  Baseline 4/10 general low back pain    Time 3    Period Weeks    Status On-going    Target Date 04/05/21      PT SHORT TERM GOAL #3   Title Demo proper lifting techniuqes of various sized objects to improve body mechanics and reduce risk for re-injury to back    Baseline poor mechanics demonstrated    Time 3    Period Weeks    Status On-going    Target Date 04/05/21               PT Long Term Goals - 03/31/21 0953       PT LONG TERM GOAL #1   Title Patient will be able to ambulate at least 350 feet in 2MWT in order to demonstrate improved gait speed for community ambulation    Baseline 250    Time 6    Period Weeks    Status On-going      PT LONG TERM GOAL #2   Title Patient will report at least 50% improvement in symptoms for improved quality of life.    Baseline 4/10 general back pain    Time 6    Period Weeks    Status On-going      PT LONG TERM GOAL #3   Title Demo improved balance as evidenced by 15 sec single leg stance    Baseline 3-5 sec    Time 6    Period Weeks    Status On-going                    Plan - 03/31/21 0953     Clinical Impression Statement Good participation throughout session and body awareness allowing for improved coordination and participation in each PT activity, including HEP. Session focused on advancing, by increasing repetitions or adding a 1# weight, and reviewing HEP and therapeutic exercises for continued strengthening and core stabilization.  Discussed log roll for in/out of bed and proper mechanics for seated HEP to reduce back discomfort. Added SLS and tandme stance balance exercises. Patient reports she has access to 1 pound weights at home. Patient reoprted 0/10 pain at end of session. Patient will continue to benefit from skilled therapy services to reduce deficits and improve functional ability.    Personal Factors and Comorbidities Age;Time since onset of injury/illness/exacerbation    Examination-Activity Limitations Bend;Carry;Lift;Locomotion Level;Reach Overhead;Squat;Transfers    Examination-Participation Restrictions Cleaning;Community Activity;Driving;Yard Work;Meal Prep    Stability/Clinical Decision Making Stable/Uncomplicated    Rehab Potential Good    PT Frequency 1x / week    PT Duration 6 weeks    PT Treatment/Interventions ADLs/Self Care Home Management;Aquatic Therapy;Gait training;Stair training;Functional mobility training;Therapeutic activities;Therapeutic exercise;Balance training;Patient/family education;Neuromuscular re-education;Manual techniques;Taping    PT Next Visit Plan core stabilization; seated position at table and in car    PT Home Exercise Plan ab set, hip flexion isometric 8/8: supine and sitting marches, bridges, overhead reach, overhead ball taps, LAQ.    Consulted and Agree with Plan of Care Patient             Patient will benefit from skilled therapeutic intervention in order to improve the following deficits and impairments:  Decreased activity tolerance, Decreased mobility, Decreased  endurance, Decreased range of motion, Decreased strength, Postural dysfunction, Improper body mechanics, Pain  Visit Diagnosis: Chronic right-sided low back pain without sciatica  Muscle weakness (generalized)  Other symptoms and signs involving the musculoskeletal system  Unsteadiness on feet  Problem List Patient Active Problem List   Diagnosis Date Noted   Weight loss 02/24/2021   Alkaline phosphatase elevation 02/24/2021   Shingles rash- Rt Flank 02/16/2020   Elevated BP without diagnosis of hypertension 02/15/2020   Hyponatremia 02/15/2020   Hypokalemia 02/15/2020   Symptomatic cholelithiasis--status post lap chole 02/13/20 02/12/2020   N&V (nausea and vomiting) Q000111Q   Biliary colic [id number]   History of lumbar laminectomy for spinal cord decompression 12/04/2019   Pamala Hurry D. Hartnett-Rands, MS, PT Per [place] [phone] Jeannie Done 03/31/2021, 11:59 AM  [facility] [address] [place], [place], [address] Phone: [phone]   Fax:  [phone]  Name: Olivia Haas MRN: [id number] Date of Birth: [birth year]

## 2021-04-06 ENCOUNTER — Encounter (HOSPITAL_COMMUNITY): Payer: Self-pay

## 2021-04-06 ENCOUNTER — Other Ambulatory Visit: Payer: Self-pay

## 2021-04-06 ENCOUNTER — Ambulatory Visit (HOSPITAL_COMMUNITY): Payer: Medicare Other

## 2021-04-06 DIAGNOSIS — R2681 Unsteadiness on feet: Secondary | ICD-10-CM | POA: Diagnosis not present

## 2021-04-06 DIAGNOSIS — M545 Low back pain, unspecified: Secondary | ICD-10-CM | POA: Diagnosis not present

## 2021-04-06 DIAGNOSIS — G8929 Other chronic pain: Secondary | ICD-10-CM | POA: Diagnosis not present

## 2021-04-06 DIAGNOSIS — M6281 Muscle weakness (generalized): Secondary | ICD-10-CM

## 2021-04-06 DIAGNOSIS — R29898 Other symptoms and signs involving the musculoskeletal system: Secondary | ICD-10-CM | POA: Diagnosis not present

## 2021-04-06 NOTE — Therapy (Signed)
[place], [place], [address] Phone: [phone]   Fax:  [phone]  Physical Therapy Treatment  Patient Details  Name: Olivia Haas MRN: [id number] Date of Birth: [birth year] Referring Provider (PT): Mordecai Rasmussen, MD   Encounter Date: 04/06/2021   PT End of Session - 04/06/21 1110     Visit Number 4    Number of Visits 6    Date for PT Re-Evaluation 04/26/21    Authorization Type Medicare    PT Start Time [id number]    PT Stop Time 1105   pt needing to leave at 11:00   PT Time Calculation (min) 33 min    Activity Tolerance Patient tolerated treatment well             Past Medical History:  Diagnosis Date   High cholesterol    Hypertension    Thyroid disease     Past Surgical History:  Procedure Laterality Date   ABDOMINAL HYSTERECTOMY     ANKLE SURGERY Right 2004   CHOLECYSTECTOMY N/A 02/13/2020   Procedure: LAPAROSCOPIC CHOLECYSTECTOMY;  Surgeon: Virl Cagey, MD;  Location: [facility];  Service: General;  Laterality: N/A;   TONSILLECTOMY      There were no vitals filed for this visit.   Subjective Assessment - 04/06/21 1034     Subjective Pt reports bad back pain, tension driving this far [id number] minutes is hard. Pt states she can only do 30 minutes of PT and wants to see if one leg is longer than the other because her daugther's is.    Limitations Lifting;Standing;Walking;House hold activities    How long can you sit comfortably? 1 hour    How long can you walk comfortably? 10-15 min    Currently in Pain? Yes    Pain Score 9     Pain Location Back    Pain Orientation Lower    Pain Descriptors / Indicators Aching;Sore    Pain Type Chronic pain    Pain Onset More than a month ago                [facility] PT Assessment - 04/06/21 0001       Special Tests   Other special tests Check ASIS-medial mal, femur length and tibia length bilaterally, no overt differences noted to indicate leg  length descrepency                  [facility] Adult PT Treatment/Exercise - 04/06/21 0001       Bed Mobility   Bed Mobility Rolling Right;Rolling Left    Rolling Right Supervision/verbal cueing    Rolling Left Supervision/Verbal cueing      Lumbar Exercises: Supine   Pelvic Tilt 20 reps    Pelvic Tilt Limitations anterior-posterior, heavy multimodal cues    Glut Set 10 reps;3 seconds    Bridge 15 reps;3 seconds                    PT Education - 04/06/21 1108     Education Details Dsicussed back precautions (avoiding bending, lifting, twisting) to minimize discomfort and allow healing, discussed muscle strengthening taking 8-12 weeks, log roll with rolling supine to sidelying in bed, leg length discrepency, and exercise technique    Person(s) Educated Patient    Methods Explanation;Demonstration;Tactile cues;Verbal cues;Handout    Comprehension Verbalized understanding;Returned demonstration;Verbal cues required;Tactile cues required              PT  Short Term Goals - 03/31/21 0953       PT SHORT TERM GOAL #1   Title Patient will be independent with HEP in order to improve functional outcomes.    Time 3    Period Weeks    Status Achieved    Target Date 04/05/21      PT SHORT TERM GOAL #2   Title Patient will report at least 25% improvement in symptoms for improved quality of life.    Baseline 4/10 general low back pain    Time 3    Period Weeks    Status On-going    Target Date 04/05/21      PT SHORT TERM GOAL #3   Title Demo proper lifting techniuqes of various sized objects to improve body mechanics and reduce risk for re-injury to back    Baseline poor mechanics demonstrated    Time 3    Period Weeks    Status On-going    Target Date 04/05/21               PT Long Term Goals - 03/31/21 0953       PT LONG TERM GOAL #1   Title Patient will be able to ambulate at least 350 feet in 2MWT in order to demonstrate improved gait speed for  community ambulation    Baseline 250    Time 6    Period Weeks    Status On-going      PT LONG TERM GOAL #2   Title Patient will report at least 50% improvement in symptoms for improved quality of life.    Baseline 4/10 general back pain    Time 6    Period Weeks    Status On-going      PT LONG TERM GOAL #3   Title Demo improved balance as evidenced by 15 sec single leg stance    Baseline 3-5 sec    Time 6    Period Weeks    Status On-going                   Plan - 04/06/21 1110     Clinical Impression Statement Added pelvic tilts and isometric glute activation, pt requiring heavy multimodal cues for form and to avoid compensation. Continued bed mobility training, educated on log rolling into sidelying when repositioning at night. Educated pt on muscle strengthening process and HEP at home. No leg length discrecpency noted. Pt very conversational throughout, motivated with exercise, requesting to only perform for 30 minutes due to muscle fatigue and needing to drive home. Continue to progress PT as able.    Personal Factors and Comorbidities Age;Time since onset of injury/illness/exacerbation    Examination-Activity Limitations Bend;Carry;Lift;Locomotion Level;Reach Overhead;Squat;Transfers    Examination-Participation Restrictions Cleaning;Community Activity;Driving;Yard Work;Meal Prep    Stability/Clinical Decision Making Stable/Uncomplicated    Rehab Potential Good    PT Frequency 1x / week    PT Duration 6 weeks    PT Treatment/Interventions ADLs/Self Care Home Management;Aquatic Therapy;Gait training;Stair training;Functional mobility training;Therapeutic activities;Therapeutic exercise;Balance training;Patient/family education;Neuromuscular re-education;Manual techniques;Taping    PT Next Visit Plan core stabilization, pelvic tilts; seated position at table and in car    PT Home Exercise Plan ab set, hip flexion isometric 8/8: supine and sitting marches, bridges,  overhead reach, overhead ball taps, LAQ. 8/24: isometric glute    Consulted and Agree with Plan of Care Patient             Patient will benefit from skilled therapeutic  intervention in order to improve the following deficits and impairments:  Decreased activity tolerance, Decreased mobility, Decreased endurance, Decreased range of motion, Decreased strength, Postural dysfunction, Improper body mechanics, Pain  Visit Diagnosis: Chronic right-sided low back pain without sciatica  Muscle weakness (generalized)  Other symptoms and signs involving the musculoskeletal system  Unsteadiness on feet     Problem List Patient Active Problem List   Diagnosis Date Noted   Weight loss 02/24/2021   Alkaline phosphatase elevation 02/24/2021   Shingles rash- Rt Flank 02/16/2020   Elevated BP without diagnosis of hypertension 02/15/2020   Hyponatremia 02/15/2020   Hypokalemia 02/15/2020   Symptomatic cholelithiasis--status post lap chole 02/13/20 02/12/2020   N&V (nausea and vomiting) [id number]   Biliary colic [id number]   History of lumbar laminectomy for spinal cord decompression 12/04/2019     Talbot Grumbling PT, DPT 04/06/21, 11:15 AM    [place], [place], [address] Phone: [phone]   Fax:  [phone]  Name: Oberia Colicchio MRN: [id number] Date of Birth: October 27, 1932

## 2021-04-07 ENCOUNTER — Encounter (INDEPENDENT_AMBULATORY_CARE_PROVIDER_SITE_OTHER): Payer: Self-pay | Admitting: Internal Medicine

## 2021-04-07 ENCOUNTER — Encounter (HOSPITAL_COMMUNITY): Payer: Medicare Other

## 2021-04-07 ENCOUNTER — Ambulatory Visit (INDEPENDENT_AMBULATORY_CARE_PROVIDER_SITE_OTHER): Payer: Medicare Other | Admitting: Internal Medicine

## 2021-04-07 VITALS — BP 145/75 | HR 81 | Temp 98.3°F | Ht 60.0 in | Wt 113.5 lb

## 2021-04-07 DIAGNOSIS — Z9049 Acquired absence of other specified parts of digestive tract: Secondary | ICD-10-CM | POA: Diagnosis not present

## 2021-04-07 DIAGNOSIS — R634 Abnormal weight loss: Secondary | ICD-10-CM

## 2021-04-07 DIAGNOSIS — R197 Diarrhea, unspecified: Secondary | ICD-10-CM | POA: Insufficient documentation

## 2021-04-07 MED ORDER — LOPERAMIDE HCL 2 MG PO CAPS: 2.0000 mg | ORAL_CAPSULE | Freq: Every day | ORAL | Status: AC | PRN

## 2021-04-07 NOTE — Progress Notes (Signed)
Presenting complaint;  Follow for weight loss.  Database and subjective:  Patient is 87 year old Caucasian female who is here for scheduled visit accompanied by her daughter Olivia Haas.  She was last seen by me on 02/24/2021 for weight loss.  I felt weight loss was triggered by constant back pain due to fracture resulting in nausea and vomiting.  Her lab studies unremarkable except mildly elevated alkaline phosphatase which is felt to be recent bone injury.  Her LFTs were normal.  She is also complaining of intermittent post prandial diarrhea.  She is advised to use Lomotil on as-needed basis.  She was begun on pantoprazole. Patient was also complaining of fluctuating blood pressures and intermittent heaviness in her left arm.  Since her last visit she was seen by Dr. Carlyle Dolly of cardiology service.  She says her tress test was normal.  She is not having the symptoms anymore.  She says her appetite has improved but it is not normal.  She feels multivitamin may have helped.  However she is not having any nausea or vomiting.  She remains with intermittent diarrhea.  On diarrhea days she may have 3 stools in the morning over a period of 1 to 2 hours.  On other days she has 1 formed stool daily.  Diarrhea started after cholecystectomy 13 months ago.  She continues to complain of back pain.  She is not taking Tylenol as recommended because she is worried about side effects.  She says when she is still she does not have any pain.  Any activity causes pain.  She does admit the pain is not as severe as it was 3 months ago but she feels frustrated because it has affected the quality of life.  She is states she has not even been to the grocery store since this happened.  Current Medications: Outpatient Encounter Medications as of 04/07/2021  Medication Sig   acetaminophen (TYLENOL) 325 MG tablet Take 2 tablets (650 mg total) by mouth every 6 (six) hours as needed for mild pain (or temp > 100).   amLODipine  (NORVASC) 2.5 MG tablet Take 1 tablet (2.5 mg total) by mouth 2 (two) times daily.   atorvastatin (LIPITOR) 20 MG tablet Take 20 mg by mouth every evening.    famotidine (PEPCID) 40 MG tablet Take 1 tablet (40 mg total) by mouth at bedtime.   levothyroxine (SYNTHROID) 88 MCG tablet Take 88 mcg by mouth every morning.   No facility-administered encounter medications on file as of 04/07/2021.     Objective: Blood pressure (!) 145/75, pulse 81, temperature 98.3 F (36.8 C), temperature source Oral, height 5' (1.524 m), weight 113 lb 8 oz (51.5 kg). Patient is alert and in no acute distress Conjunctiva is pink. Sclera is nonicteric Oropharyngeal mucosa is normal. No neck masses or thyromegaly noted. Cardiac exam with regular rhythm normal S1 and S2. No murmur or gallop noted. Lungs are clear to auscultation. Abdomen is symmetrical soft and nontender with organomegaly or masses. No LE edema or clubbing noted.  Labs/studies Results:   CBC Latest Ref Rng & Units 02/24/2021 01/06/2021 02/16/2020  WBC 3.8 - 10.8 Thousand/uL 7.4 13.7(H) 9.6  Hemoglobin 11.7 - 15.5 g/dL 12.9 15.8(H) 13.2  Hematocrit 35.0 - 45.0 % 39.5 46.7(H) 39.4  Platelets 140 - 400 Thousand/uL 319 308 274    CMP Latest Ref Rng & Units 02/24/2021 01/06/2021 02/16/2020  Glucose 65 - 139 mg/dL 101 133(H) 113(H)  BUN 7 - 25 mg/dL 16 10 7(L)  Creatinine  0.60 - 0.95 mg/dL 0.73 0.64 0.51  Sodium 135 - 146 mmol/L 139 135 131(L)  Potassium 3.5 - 5.3 mmol/L 4.6 3.4(L) 3.6  Chloride 98 - 110 mmol/L 105 102 102  CO2 20 - 32 mmol/L 25 23 19(L)  Calcium 8.6 - 10.4 mg/dL 10.0 9.8 8.6(L)  Total Protein 6.1 - 8.1 g/dL 6.7 8.4(H) -  Total Bilirubin 0.2 - 1.2 mg/dL 0.3 1.2 -  Alkaline Phos 38 - 126 U/L - 141(H) -  AST 10 - 35 U/L 14 29 -  ALT 6 - 29 U/L 12 23 -    Hepatic Function Latest Ref Rng & Units 02/24/2021 01/06/2021 02/15/2020  Total Protein 6.1 - 8.1 g/dL 6.7 8.4(H) 7.0  Albumin 3.5 - 5.0 g/dL - 5.1(H) 4.2  AST 10 - 35 U/L 14 29 41   ALT 6 - 29 U/L 12 23 43  Alk Phosphatase 38 - 126 U/L - 141(H) 91  Total Bilirubin 0.2 - 1.2 mg/dL 0.3 1.2 1.3(H)     Assessment:  #1.  Weight loss.  Weight loss has leveled off.  Hopefully between now and her next visit she would gain few pounds and we would stop putting.   #2. N/V.  Nausea vomiting has resolved.  We will leave her on famotidine for the time being.  #2.  Back pain secondary to compressed T12 and L1 vertebrae.  This happened 3 months ago when she fell.  She is having daily pain affecting the quality of her life.  Therefore she would benefit from referral to spine center as she may be a candidate for vertebroplasty.  #3.  Postcholecystectomy diarrhea.  Symptoms are suggestive of IBS.  She can use loperamide OTC 2 mg daily as needed.  She can use it on days when she has to leave house.  Would not recommend daily use that she might get constipated and may make her back pain worse.  Plan:  Continue famotidine at 40 mg p.o. nightly. Loperamide OTC 2 mg daily as needed. Patient advised to eat least 2 snacks between meals. Referral to spine center in [facility] for further evaluation of T12 and L1 partial compression fractures resulting in daily pain. Office visit in 2 months.

## 2021-04-07 NOTE — Patient Instructions (Addendum)
Referral to spine center in [facility] for persistent back pain due to compression fracture of T12 and L1 vertebrae. Remember to eat at least 2 snacks in between meals every day. Take Imodium OTC 2 mg on days when he have to be out of the house.  If 2 mg results in constipation then you can use 1 mg or half a tablet each time.

## 2021-04-13 ENCOUNTER — Encounter (HOSPITAL_COMMUNITY): Payer: Self-pay | Admitting: Physical Therapy

## 2021-04-13 ENCOUNTER — Telehealth: Payer: Self-pay

## 2021-04-13 DIAGNOSIS — S22000A Wedge compression fracture of unspecified thoracic vertebra, initial encounter for closed fracture: Secondary | ICD-10-CM

## 2021-04-13 NOTE — Telephone Encounter (Signed)
Daughter of pt called stating pt is still having significant pain from her T12 compression fx (Seen 02/09/21). Pt was referred to Neurosurgeon by her Gastroenterologist but she is in need of an MRI. Daughter would like to know if you would be willing to order this for the pt. Please advise.

## 2021-04-13 NOTE — Therapy (Signed)
[place], [place], [address] Phone: [phone]   Fax:  [phone]  Patient Details  Name: Olivia Haas MRN: [id number] Date of Birth: 1933-01-03 Referring Provider:  No ref. provider found  Encounter Date: 04/13/2021  PHYSICAL THERAPY DISCHARGE SUMMARY  Visits from Start of Care: 4  Current functional level related to goals / functional outcomes: Unknown as patient has not returned.   Remaining deficits: Unknown as patient has not returned.   Education / Equipment: Unknown as patient has not returned.   Patient agrees to discharge. Patient goals were not met. Patient is being discharged due to the patient's request.  8:54 AM, 04/13/21 Mearl Latin PT, DPT Physical Therapist at [place], [place], [address] Phone: [phone]   Fax:  [phone]

## 2021-04-14 ENCOUNTER — Encounter (HOSPITAL_COMMUNITY): Payer: Medicare Other

## 2021-04-14 ENCOUNTER — Encounter (HOSPITAL_COMMUNITY): Payer: Medicare Other | Admitting: Physical Therapy

## 2021-04-20 ENCOUNTER — Encounter (HOSPITAL_COMMUNITY): Payer: Medicare Other | Admitting: Physical Therapy

## 2021-04-21 ENCOUNTER — Encounter (HOSPITAL_COMMUNITY): Payer: Medicare Other

## 2021-04-26 ENCOUNTER — Ambulatory Visit (HOSPITAL_COMMUNITY)
Admission: RE | Admit: 2021-04-26 | Discharge: 2021-04-26 | Disposition: A | Discharge status: Home / Self Care | Payer: Medicare Other | Source: Ambulatory Visit | Attending: Orthopedic Surgery | Admitting: Orthopedic Surgery

## 2021-04-26 ENCOUNTER — Other Ambulatory Visit: Payer: Self-pay

## 2021-04-26 DIAGNOSIS — S22000A Wedge compression fracture of unspecified thoracic vertebra, initial encounter for closed fracture: Secondary | ICD-10-CM | POA: Insufficient documentation

## 2021-04-26 DIAGNOSIS — M4314 Spondylolisthesis, thoracic region: Secondary | ICD-10-CM | POA: Diagnosis not present

## 2021-04-26 DIAGNOSIS — M4804 Spinal stenosis, thoracic region: Secondary | ICD-10-CM | POA: Diagnosis not present

## 2021-04-26 DIAGNOSIS — M5124 Other intervertebral disc displacement, thoracic region: Secondary | ICD-10-CM | POA: Diagnosis not present

## 2021-04-28 ENCOUNTER — Encounter (HOSPITAL_COMMUNITY): Payer: Medicare Other

## 2021-04-28 DIAGNOSIS — I10 Essential (primary) hypertension: Secondary | ICD-10-CM | POA: Diagnosis not present

## 2021-04-28 DIAGNOSIS — M47816 Spondylosis without myelopathy or radiculopathy, lumbar region: Secondary | ICD-10-CM | POA: Diagnosis not present

## 2021-04-28 DIAGNOSIS — S22080S Wedge compression fracture of T11-T12 vertebra, sequela: Secondary | ICD-10-CM | POA: Diagnosis not present

## 2021-04-28 DIAGNOSIS — Z6822 Body mass index (BMI) 22.0-22.9, adult: Secondary | ICD-10-CM | POA: Diagnosis not present

## 2021-04-28 DIAGNOSIS — M545 Low back pain, unspecified: Secondary | ICD-10-CM | POA: Diagnosis not present

## 2021-05-03 ENCOUNTER — Ambulatory Visit: Payer: Medicare Other | Admitting: Cardiology

## 2021-05-25 DIAGNOSIS — Z23 Encounter for immunization: Secondary | ICD-10-CM | POA: Diagnosis not present

## 2021-05-25 DIAGNOSIS — Z20828 Contact with and (suspected) exposure to other viral communicable diseases: Secondary | ICD-10-CM | POA: Diagnosis not present

## 2021-05-26 DIAGNOSIS — S22080S Wedge compression fracture of T11-T12 vertebra, sequela: Secondary | ICD-10-CM | POA: Diagnosis not present

## 2021-05-26 DIAGNOSIS — M47816 Spondylosis without myelopathy or radiculopathy, lumbar region: Secondary | ICD-10-CM | POA: Diagnosis not present

## 2021-06-09 ENCOUNTER — Ambulatory Visit (INDEPENDENT_AMBULATORY_CARE_PROVIDER_SITE_OTHER): Payer: Medicare Other | Admitting: Internal Medicine

## 2021-06-09 ENCOUNTER — Other Ambulatory Visit: Payer: Self-pay

## 2021-06-09 ENCOUNTER — Encounter (INDEPENDENT_AMBULATORY_CARE_PROVIDER_SITE_OTHER): Payer: Self-pay | Admitting: Internal Medicine

## 2021-06-09 VITALS — BP 148/83 | HR 89 | Temp 98.9°F | Ht 60.0 in | Wt 115.1 lb

## 2021-06-09 DIAGNOSIS — R634 Abnormal weight loss: Secondary | ICD-10-CM | POA: Diagnosis not present

## 2021-06-09 MED ORDER — FAMOTIDINE 40 MG PO TABS: 40.0000 mg | ORAL_TABLET | Freq: Every day | ORAL | 2 refills | Status: DC

## 2021-06-09 NOTE — Patient Instructions (Signed)
Remember to eat at least 2 snacks between meals Notify if you have nausea or vomiting.

## 2021-06-09 NOTE — Progress Notes (Signed)
Presenting complaint;  Follow-up for weight loss.  Database and subjective:  Patient is 87 year old Caucasian female who is here for scheduled visit accompanied by her daughter [name]. Her initial visit was on 02/24/2021 per 13 pound weight loss and she also experienced 2 weeks of nausea and vomiting which usually occurred when she have severe back pain.  Back pain started after she fell on 01/06/2021 and sustained compression fracture at T12.  She has not taken any NSAIDs.  She was also complaining of intermittent postprandial diarrhea which apparently started after she had cholecystectomy.  CBC comprehensive chemistry panel and sed rate were unremarkable.  Patient was begun on famotidine and advised to take Tylenol on schedule but no more than 2 g/day. She returns for follow-up visit on 04/08/2019 07/22/2019 01/01/2011 and she has not lost any weight but she has not gained any more.  She did not have any more episodes of nausea and vomiting.  She was continued to complain of back pain which was affecting the quality of her life.  She was referred to the spine center in [facility] for further evaluation.  Patient states her appetite has improved.  This occurred about a month ago.  She has gained 2 pounds since her last visit.  She continues to complain of back pain.  She thought the pain has not improved any but her daughter [name] pointed out to her that she was able to do yard work over the weekend and she also burned some brush and she has been able to go to Sunday school on 2 Sundays which she had not been able to previously.  Patient states that she does have some moments when she does not have any pain.  She says she likes to sleep on her stomach but not able to do so because it makes her pain worse.  She is not having any heartburn dysphagia abdominal pain melena or rectal bleeding. She tells me her physician in [place] felt her ongoing pain was not due to compression fracture of T12.  She was given  6 days of prednisone which prevented him sleeping for 3 days.  She was also given Zanaflex but it did not help.  She has follow-up visit in spine center in about 2 weeks. Patient also complains of right ankle pain.  She had fracture and had hardware put in.  She is also concerned that her feet position may not be right when she ambulates.  Current Medications: Outpatient Encounter Medications as of 06/09/2021  Medication Sig   acetaminophen (TYLENOL) 325 MG tablet Take 2 tablets (650 mg total) by mouth every 6 (six) hours as needed for mild pain (or temp > 100).   amLODipine (NORVASC) 2.5 MG tablet Take 1 tablet (2.5 mg total) by mouth 2 (two) times daily.   atorvastatin (LIPITOR) 20 MG tablet Take 20 mg by mouth every evening.    famotidine (PEPCID) 40 MG tablet Take 1 tablet (40 mg total) by mouth at bedtime.   levothyroxine (SYNTHROID) 88 MCG tablet Take 88 mcg by mouth every morning.   tizanidine (ZANAFLEX) 2 MG capsule Take 2 mg by mouth at bedtime as needed.   loperamide (IMODIUM) 2 MG capsule Take 1 capsule (2 mg total) by mouth daily as needed for diarrhea or loose stools. (Patient not taking: Reported on 06/09/2021)   No facility-administered encounter medications on file as of 06/09/2021.     Objective: Blood pressure (!) 148/83, pulse 89, temperature 98.9 F (37.2 C), temperature source Oral, height  5' (1.524 m), weight 115 lb 1.6 oz (52.2 kg). Patient is alert and in no acute distress. Conjunctiva is pink. Sclera is nonicteric Oropharyngeal mucosa is normal. No neck masses or thyromegaly noted. Cardiac exam with regular rhythm normal S1 and S2. No murmur or gallop noted. Lungs are clear to auscultation. Abdomen is symmetrical soft and nontender with organomegaly or masses. No LE edema or clubbing noted.  Lab data  MRI thoracic spine from 04/26/2021 results noted.  Assessment:  #1.  Weight loss.  She is finally turning around.  Weight loss would appear to be related to  severe back pain which started after a fall resulting in T12 fracture.  Pain is gradually improving but she is not 100% better which is her goal and expectation.    #2.  History of nausea and vomiting.  She certainly could have developed peptic ulcer disease early on with acute pain.  She remains on famotidine 40 mg daily and doing well.  Would consider changing dose at the time of next visit.  #3.  Back pain as well as right ankle pain.  She definitely would benefit from follow-up with spine specialist.  She would also benefit from reevaluation by physical therapist.  Plan:  Continue famotidine at current dose of 40 mg daily at bedtime. Patient will call if she has abdominal pain nausea or vomiting. Office visit in 3 months.

## 2021-06-10 ENCOUNTER — Ambulatory Visit (INDEPENDENT_AMBULATORY_CARE_PROVIDER_SITE_OTHER): Payer: Medicare Other | Admitting: Cardiology

## 2021-06-10 ENCOUNTER — Encounter: Payer: Self-pay | Admitting: Cardiology

## 2021-06-10 VITALS — BP 102/64 | HR 73 | Ht 60.0 in | Wt 117.0 lb

## 2021-06-10 DIAGNOSIS — R0789 Other chest pain: Secondary | ICD-10-CM | POA: Diagnosis not present

## 2021-06-10 DIAGNOSIS — I10 Essential (primary) hypertension: Secondary | ICD-10-CM

## 2021-06-10 NOTE — Progress Notes (Signed)
Clinical Summary Olivia Haas is a 87 y.o.female seen today for follow up of the following medical problems.    1.Chest pain - left arm shooting pain. Can have some SOB at times but not specifically associated with arm pain.  - pain can last a few hours. Some improvement with position. Occurs 3-4 times a week - prior to recent fall was very physically, now more sedentary - can some DOE with short distances     CAD risk factors: HTN, hyperlipidemia, father MI 38     02/2021 nuclear stress: no ischemia - chronic pains unchanged    2. HTN - new diagnosis of HTN - has been on norasc 2.5mg  daily. Home bp's 200s/90, this was off her norvasc - home bp's 110-160s/70s, avg 140s/70s  - home 120s-130s/60-80s. Rare SBP to 150 at times.      3. Weight loss - ongoing workup per GI     Past Medical History:  Diagnosis Date   High cholesterol    Hypertension    Thyroid disease      Allergies  Allergen Reactions   Cucumber Extract Other (See Comments)    Vomiting   Strawberry (Diagnostic) Other (See Comments)    Vomiting      Current Outpatient Medications  Medication Sig Dispense Refill   acetaminophen (TYLENOL) 325 MG tablet Take 2 tablets (650 mg total) by mouth every 6 (six) hours as needed for mild pain (or temp > 100). 12 tablet 0   amLODipine (NORVASC) 2.5 MG tablet Take 1 tablet (2.5 mg total) by mouth 2 (two) times daily. 60 tablet 6   atorvastatin (LIPITOR) 20 MG tablet Take 20 mg by mouth every evening.      famotidine (PEPCID) 40 MG tablet Take 1 tablet (40 mg total) by mouth at bedtime. 30 tablet 2   levothyroxine (SYNTHROID) 88 MCG tablet Take 88 mcg by mouth every morning.     loperamide (IMODIUM) 2 MG capsule Take 1 capsule (2 mg total) by mouth daily as needed for diarrhea or loose stools. (Patient not taking: Reported on 06/09/2021)     tizanidine (ZANAFLEX) 2 MG capsule Take 2 mg by mouth at bedtime as needed.     No current facility-administered  medications for this visit.     Past Surgical History:  Procedure Laterality Date   ABDOMINAL HYSTERECTOMY     ANKLE SURGERY Right 2004   CHOLECYSTECTOMY N/A 02/13/2020   Procedure: LAPAROSCOPIC CHOLECYSTECTOMY;  Surgeon: Virl Cagey, MD;  Location: [facility];  Service: General;  Laterality: N/A;   TONSILLECTOMY       Allergies  Allergen Reactions   Cucumber Extract Other (See Comments)    Vomiting   Strawberry (Diagnostic) Other (See Comments)    Vomiting       Family History  Problem Relation Age of Onset   Heart attack Father    Anesthesia problems Daughter        Sensitive to medication/ waking up      Social History Ms. Calais reports that she has never smoked. She has never used smokeless tobacco. Ms. Olthoff reports no history of alcohol use.   Review of Systems CONSTITUTIONAL: No weight loss, fever, chills, weakness or fatigue.  HEENT: Eyes: No visual loss, blurred vision, double vision or yellow sclerae.No hearing loss, sneezing, congestion, runny nose or sore throat.  SKIN: No rash or itching.  CARDIOVASCULAR: per hpi RESPIRATORY: No shortness of breath, cough or sputum.  GASTROINTESTINAL: No anorexia, nausea, vomiting  or diarrhea. No abdominal pain or blood.  GENITOURINARY: No burning on urination, no polyuria NEUROLOGICAL: No headache, dizziness, syncope, paralysis, ataxia, numbness or tingling in the extremities. No change in bowel or bladder control.  MUSCULOSKELETAL: No muscle, back pain, joint pain or stiffness.  LYMPHATICS: No enlarged nodes. No history of splenectomy.  PSYCHIATRIC: No history of depression or anxiety.  ENDOCRINOLOGIC: No reports of sweating, cold or heat intolerance. No polyuria or polydipsia.  Marland Kitchen   Physical Examination Today's Vitals   06/10/21 1353  BP: 102/64  Pulse: 73  SpO2: 97%  Weight: 117 lb (53.1 kg)  Height: 5' (1.524 m)   Body mass index is 22.85 kg/m.  Gen: resting comfortably, no acute distress HEENT:  no scleral icterus, pupils equal round and reactive, no palptable cervical adenopathy,  CV: RRR, no m/r/g no jvd Resp: Clear to auscultation bilaterally GI: abdomen is soft, non-tender, non-distended, normal bowel sounds, no hepatosplenomegaly MSK: extremities are warm, no edema.  Skin: warm, no rash Neuro:  no focal deficits Psych: appropriate affect   Diagnostic Studies  02/2021 nuclear stress There was no ST segment deviation noted during stress. No T wave inversion was noted during stress. The study is normal. There are no perfusion defects This is a low risk study. The left ventricular ejection fraction is hyperdynamic (>65%).   Assessment and Plan  Chest pain/left arm pain -somewhat atypical symptoms  - recent nuclear stress was benign - no further cardiac testing planned   2. HTN - previous labile bp's, on norvasc 2.5mg  bid has done well - continue current meds        Arnoldo Lenis, M.D.

## 2021-06-10 NOTE — Patient Instructions (Signed)
Medication Instructions:  None *If you need a refill on your cardiac medications before your next appointment, please call your pharmacy*   Lab Work: We have requested your most recent labs from your primary care provider If you have labs (blood work) drawn today and your tests are completely normal, you will receive your results only by: [place] (if you have MyChart) OR A paper copy in the mail If you have any lab test that is abnormal or we need to change your treatment, we will call you to review the results.   Testing/Procedures: None   Follow-Up: At [facility], you and your health needs are our priority.  As part of our continuing mission to provide you with exceptional heart care, we have created designated Provider Care Teams.  These Care Teams include your primary Cardiologist (physician) and Advanced Practice Providers (APPs -  Physician Assistants and Nurse Practitioners) who all work together to provide you with the care you need, when you need it.  We recommend signing up for the patient portal called "MyChart".  Sign up information is provided on this After Visit Summary.  MyChart is used to connect with patients for Virtual Visits (Telemedicine).  Patients are able to view lab/test results, encounter notes, upcoming appointments, etc.  Non-urgent messages can be sent to your provider as well.   To learn more about what you can do with MyChart, go to [email or web address].    Your next appointment:   6 month(s)  The format for your next appointment: In Person  Provider:   Carlyle Dolly, MD   Other Instructions

## 2021-06-15 DIAGNOSIS — I700 Atherosclerosis of aorta: Secondary | ICD-10-CM | POA: Diagnosis not present

## 2021-06-15 DIAGNOSIS — M545 Low back pain, unspecified: Secondary | ICD-10-CM | POA: Diagnosis not present

## 2021-06-15 DIAGNOSIS — E038 Other specified hypothyroidism: Secondary | ICD-10-CM | POA: Diagnosis not present

## 2021-06-15 DIAGNOSIS — L570 Actinic keratosis: Secondary | ICD-10-CM | POA: Diagnosis not present

## 2021-06-28 DIAGNOSIS — M5451 Vertebrogenic low back pain: Secondary | ICD-10-CM | POA: Diagnosis not present

## 2021-06-28 DIAGNOSIS — M5136 Other intervertebral disc degeneration, lumbar region: Secondary | ICD-10-CM | POA: Diagnosis not present

## 2021-08-17 DIAGNOSIS — M9903 Segmental and somatic dysfunction of lumbar region: Secondary | ICD-10-CM | POA: Diagnosis not present

## 2021-08-17 DIAGNOSIS — M47816 Spondylosis without myelopathy or radiculopathy, lumbar region: Secondary | ICD-10-CM | POA: Diagnosis not present

## 2021-08-17 DIAGNOSIS — S336XXA Sprain of sacroiliac joint, initial encounter: Secondary | ICD-10-CM | POA: Diagnosis not present

## 2021-08-19 ENCOUNTER — Other Ambulatory Visit: Payer: Self-pay | Admitting: Cardiology

## 2021-08-22 DIAGNOSIS — S336XXA Sprain of sacroiliac joint, initial encounter: Secondary | ICD-10-CM | POA: Diagnosis not present

## 2021-08-22 DIAGNOSIS — M47816 Spondylosis without myelopathy or radiculopathy, lumbar region: Secondary | ICD-10-CM | POA: Diagnosis not present

## 2021-08-22 DIAGNOSIS — M9903 Segmental and somatic dysfunction of lumbar region: Secondary | ICD-10-CM | POA: Diagnosis not present

## 2021-08-25 DIAGNOSIS — S336XXA Sprain of sacroiliac joint, initial encounter: Secondary | ICD-10-CM | POA: Diagnosis not present

## 2021-08-25 DIAGNOSIS — M47816 Spondylosis without myelopathy or radiculopathy, lumbar region: Secondary | ICD-10-CM | POA: Diagnosis not present

## 2021-08-25 DIAGNOSIS — M9903 Segmental and somatic dysfunction of lumbar region: Secondary | ICD-10-CM | POA: Diagnosis not present

## 2021-08-29 DIAGNOSIS — M9903 Segmental and somatic dysfunction of lumbar region: Secondary | ICD-10-CM | POA: Diagnosis not present

## 2021-08-29 DIAGNOSIS — M47816 Spondylosis without myelopathy or radiculopathy, lumbar region: Secondary | ICD-10-CM | POA: Diagnosis not present

## 2021-08-29 DIAGNOSIS — S336XXA Sprain of sacroiliac joint, initial encounter: Secondary | ICD-10-CM | POA: Diagnosis not present

## 2021-09-01 DIAGNOSIS — M47816 Spondylosis without myelopathy or radiculopathy, lumbar region: Secondary | ICD-10-CM | POA: Diagnosis not present

## 2021-09-01 DIAGNOSIS — M9903 Segmental and somatic dysfunction of lumbar region: Secondary | ICD-10-CM | POA: Diagnosis not present

## 2021-09-01 DIAGNOSIS — S336XXA Sprain of sacroiliac joint, initial encounter: Secondary | ICD-10-CM | POA: Diagnosis not present

## 2021-09-05 DIAGNOSIS — M47816 Spondylosis without myelopathy or radiculopathy, lumbar region: Secondary | ICD-10-CM | POA: Diagnosis not present

## 2021-09-05 DIAGNOSIS — S336XXA Sprain of sacroiliac joint, initial encounter: Secondary | ICD-10-CM | POA: Diagnosis not present

## 2021-09-05 DIAGNOSIS — S43491A Other sprain of right shoulder joint, initial encounter: Secondary | ICD-10-CM | POA: Diagnosis not present

## 2021-09-05 DIAGNOSIS — M9903 Segmental and somatic dysfunction of lumbar region: Secondary | ICD-10-CM | POA: Diagnosis not present

## 2021-09-27 ENCOUNTER — Ambulatory Visit (INDEPENDENT_AMBULATORY_CARE_PROVIDER_SITE_OTHER): Payer: Medicare Other | Admitting: Internal Medicine

## 2021-09-28 ENCOUNTER — Encounter: Payer: Self-pay | Admitting: Cardiology

## 2021-09-28 DIAGNOSIS — E038 Other specified hypothyroidism: Secondary | ICD-10-CM | POA: Diagnosis not present

## 2021-09-28 DIAGNOSIS — M47814 Spondylosis without myelopathy or radiculopathy, thoracic region: Secondary | ICD-10-CM | POA: Diagnosis not present

## 2021-09-28 DIAGNOSIS — M545 Low back pain, unspecified: Secondary | ICD-10-CM | POA: Diagnosis not present

## 2021-09-28 DIAGNOSIS — M546 Pain in thoracic spine: Secondary | ICD-10-CM | POA: Diagnosis not present

## 2021-09-28 DIAGNOSIS — E7849 Other hyperlipidemia: Secondary | ICD-10-CM | POA: Diagnosis not present

## 2021-09-28 DIAGNOSIS — I700 Atherosclerosis of aorta: Secondary | ICD-10-CM | POA: Diagnosis not present

## 2021-09-28 DIAGNOSIS — I10 Essential (primary) hypertension: Secondary | ICD-10-CM | POA: Diagnosis not present

## 2021-09-28 DIAGNOSIS — S43491A Other sprain of right shoulder joint, initial encounter: Secondary | ICD-10-CM | POA: Diagnosis not present

## 2021-09-28 DIAGNOSIS — F411 Generalized anxiety disorder: Secondary | ICD-10-CM | POA: Diagnosis not present

## 2021-09-28 DIAGNOSIS — M810 Age-related osteoporosis without current pathological fracture: Secondary | ICD-10-CM | POA: Diagnosis not present

## 2021-09-28 DIAGNOSIS — Z0000 Encounter for general adult medical examination without abnormal findings: Secondary | ICD-10-CM | POA: Diagnosis not present

## 2021-11-08 ENCOUNTER — Ambulatory Visit (INDEPENDENT_AMBULATORY_CARE_PROVIDER_SITE_OTHER): Payer: Medicare Other | Admitting: Internal Medicine

## 2021-12-07 ENCOUNTER — Encounter: Payer: Self-pay | Admitting: *Deleted

## 2021-12-07 ENCOUNTER — Ambulatory Visit: Payer: Medicare Other | Admitting: Cardiology

## 2021-12-07 ENCOUNTER — Encounter: Payer: Self-pay | Admitting: Cardiology

## 2021-12-07 VITALS — BP 124/70 | HR 74 | Ht 60.0 in | Wt 126.4 lb

## 2021-12-07 DIAGNOSIS — R0789 Other chest pain: Secondary | ICD-10-CM

## 2021-12-07 DIAGNOSIS — I10 Essential (primary) hypertension: Secondary | ICD-10-CM

## 2021-12-07 NOTE — Progress Notes (Signed)
? ? ? ?Clinical Summary ?Olivia Haas is a 88 y.o.female seen today for follow up of the following medical problems.  ?  ?  ?1.Chest pain ?- left arm shooting pain. Can have some SOB at times but not specifically associated with arm pain.  ?- pain can last a few hours. Some improvement with position. Occurs 3-4 times a week ?- prior to recent fall was very physically, now more sedentary ?- can some DOE with short distances ?  ?  ?CAD risk factors: HTN, hyperlipidemia, father MI 26  ?  ? 02/2021 nuclear stress: no ischemia ?-chronic MSK pains, no specific chest pains.  ?  ?  ?2. HTN ?- home 120s-130s/60-80s.  ?- reports some mild LE edema since norvasc change. No SOB/DOE.  ?  ? ? ? ?Past Medical History:  ?Diagnosis Date  ? High cholesterol   ? Hypertension   ? Thyroid disease   ? ? ? ?Allergies  ?Allergen Reactions  ? Cucumber Extract Other (See Comments)  ?  Vomiting  ? Strawberry (Diagnostic) Other (See Comments)  ?  Vomiting   ? ? ? ?Current Outpatient Medications  ?Medication Sig Dispense Refill  ? acetaminophen (TYLENOL) 325 MG tablet Take 2 tablets (650 mg total) by mouth every 6 (six) hours as needed for mild pain (or temp > 100). 12 tablet 0  ? amLODipine (NORVASC) 2.5 MG tablet TAKE 1 TABLET BY MOUTH TWICE A DAY 180 tablet 2  ? atorvastatin (LIPITOR) 20 MG tablet Take 20 mg by mouth every evening.     ? famotidine (PEPCID) 40 MG tablet Take 1 tablet (40 mg total) by mouth at bedtime. 30 tablet 2  ? levothyroxine (SYNTHROID) 88 MCG tablet Take 88 mcg by mouth every morning.    ? loperamide (IMODIUM) 2 MG capsule Take 1 capsule (2 mg total) by mouth daily as needed for diarrhea or loose stools.    ? tizanidine (ZANAFLEX) 2 MG capsule Take 2 mg by mouth at bedtime as needed.    ? ?No current facility-administered medications for this visit.  ? ? ? ?Past Surgical History:  ?Procedure Laterality Date  ? ABDOMINAL HYSTERECTOMY    ? ANKLE SURGERY Right 2004  ? CHOLECYSTECTOMY N/A 02/13/2020  ? Procedure: LAPAROSCOPIC  CHOLECYSTECTOMY;  Surgeon: Virl Cagey, MD;  Location: [facility];  Service: General;  Laterality: N/A;  ? TONSILLECTOMY    ? ? ? ?Allergies  ?Allergen Reactions  ? Cucumber Extract Other (See Comments)  ?  Vomiting  ? Strawberry (Diagnostic) Other (See Comments)  ?  Vomiting   ? ? ? ? ?Family History  ?Problem Relation Age of Onset  ? Heart attack Father   ? Anesthesia problems Daughter   ?     Sensitive to medication/ waking up   ? ? ? ?Social History ?Olivia Haas reports that she has never smoked. She has never used smokeless tobacco. ?Olivia Haas reports no history of alcohol use. ? ? ?Review of Systems ?CONSTITUTIONAL: No weight loss, fever, chills, weakness or fatigue.  ?HEENT: Eyes: No visual loss, blurred vision, double vision or yellow sclerae.No hearing loss, sneezing, congestion, runny nose or sore throat.  ?SKIN: No rash or itching.  ?CARDIOVASCULAR: per hpi ?RESPIRATORY: No shortness of breath, cough or sputum.  ?GASTROINTESTINAL: No anorexia, nausea, vomiting or diarrhea. No abdominal pain or blood.  ?GENITOURINARY: No burning on urination, no polyuria ?NEUROLOGICAL: No headache, dizziness, syncope, paralysis, ataxia, numbness or tingling in the extremities. No change in bowel or bladder control.  ?  MUSCULOSKELETAL: No muscle, back pain, joint pain or stiffness.  ?LYMPHATICS: No enlarged nodes. No history of splenectomy.  ?PSYCHIATRIC: No history of depression or anxiety.  ?ENDOCRINOLOGIC: No reports of sweating, cold or heat intolerance. No polyuria or polydipsia.  ?. ? ? ?Physical Examination ?Today's Vitals  ? 12/07/21 1549  ?BP: 124/70  ?Pulse: 74  ?SpO2: 97%  ?Weight: 126 lb 6.4 oz (57.3 kg)  ?Height: 5' (1.524 m)  ? ?Body mass index is 24.69 kg/m?. ? ?Gen: resting comfortably, no acute distress ?HEENT: no scleral icterus, pupils equal round and reactive, no palptable cervical adenopathy,  ?CV: RRR, no m/r/g no jvd ?Resp: Clear to auscultation bilaterally ?GI: abdomen is soft, non-tender,  non-distended, normal bowel sounds, no hepatosplenomegaly ?MSK: extremities are warm, 1+bilateral LE edema ?Skin: warm, no rash ?Neuro:  no focal deficits ?Psych: appropriate affect ? ? ?Diagnostic Studies ? ?02/2021 nuclear stress ?There was no ST segment deviation noted during stress. ?No T wave inversion was noted during stress. ?The study is normal. There are no perfusion defects ?This is a low risk study. ?The left ventricular ejection fraction is hyperdynamic (>65%). ? ? ?Assessment and Plan  ?Chest pain/left arm pain ?-somewhat atypical symptoms  ?- nuclear stress test was benign, no further testing indicated ?  ?2. HTN ?- bp's overall controlled ?- some mild LE edema, we discussed possibly changing to alternative such as losartan but reports swelling is mild and tolerable, will continue for norvasc for now. No signs of any fluid anywhere else, no symptoms to suggest change in cardiac function as cause of her swelling.  ? ?F/u as needed ? ? ?Arnoldo Lenis, M.D. ?

## 2021-12-07 NOTE — Patient Instructions (Addendum)
Medication Instructions:  Continue all current medications.  Labwork: none  Testing/Procedures: none  Follow-Up: As needed.    Any Other Special Instructions Will Be Listed Below (If Applicable).  If you need a refill on your cardiac medications before your next appointment, please call your pharmacy.  

## 2021-12-28 ENCOUNTER — Encounter: Payer: Self-pay | Admitting: Orthopaedic Surgery

## 2021-12-28 ENCOUNTER — Ambulatory Visit (INDEPENDENT_AMBULATORY_CARE_PROVIDER_SITE_OTHER): Payer: Medicare Other

## 2021-12-28 ENCOUNTER — Ambulatory Visit: Payer: Self-pay

## 2021-12-28 ENCOUNTER — Ambulatory Visit (INDEPENDENT_AMBULATORY_CARE_PROVIDER_SITE_OTHER): Payer: Medicare Other | Admitting: Orthopaedic Surgery

## 2021-12-28 VITALS — Ht 60.0 in | Wt 123.0 lb

## 2021-12-28 DIAGNOSIS — G8929 Other chronic pain: Secondary | ICD-10-CM

## 2021-12-28 DIAGNOSIS — M25511 Pain in right shoulder: Secondary | ICD-10-CM | POA: Diagnosis not present

## 2021-12-28 DIAGNOSIS — M25512 Pain in left shoulder: Secondary | ICD-10-CM

## 2021-12-28 DIAGNOSIS — M25561 Pain in right knee: Secondary | ICD-10-CM | POA: Diagnosis not present

## 2021-12-28 MED ORDER — BUPIVACAINE HCL 0.25 % IJ SOLN
2.0000 mL | INTRAMUSCULAR | Status: AC | PRN
  Administered 2021-12-28: 2 mL via INTRA_ARTICULAR

## 2021-12-28 MED ORDER — METHYLPREDNISOLONE ACETATE 40 MG/ML IJ SUSP
80.0000 mg | INTRAMUSCULAR | Status: AC | PRN
  Administered 2021-12-28: 80 mg via INTRA_ARTICULAR

## 2021-12-28 NOTE — Progress Notes (Signed)
? ?Office Visit Note ?  ?Patient: Olivia Haas           ?Date of Birth: 1932/11/20           ?MRN: [id number] ?Visit Date: 12/28/2021 ?             ?Requested by: Neale Burly, MD ?[address] ?[place],  [place] [address] ?PCP: Neale Burly, MD ? ? ?Assessment & Plan: ?Visit Diagnoses: Bilateral shoulder pain.  Right knee pain ? ?Plan: Patient is a pleasant 88 year old woman who comes in a year after falling off a ladder.  She complains of right greater than left shoulder pain and right knee pain.  She was told by Dr. Durward Fortes in the past that she does have right knee arthritis.  She points to the pain in both of her shoulders.  Denies any neck pain denies any paresthesias we will go forward inject her right knee today.  She will return in 2 weeks for a cortisone injection into her right shoulder ? ?Follow-Up Instructions: No follow-ups on file.  ? ?Orders:  ?No orders of the defined types were placed in this encounter. ? ?No orders of the defined types were placed in this encounter. ? ? ? ? Procedures: ?Large Joint Inj: R knee on 12/28/2021 10:11 AM ?Indications: pain and diagnostic evaluation ?Details: 25 G 1.5 in needle, anteromedial approach ? ?Arthrogram: No ? ?Medications: 80 mg methylPREDNISolone acetate 40 MG/ML; 2 mL bupivacaine 0.25 % ?Outcome: tolerated well, no immediate complications ?Procedure, treatment alternatives, risks and benefits explained, specific risks discussed. Consent was given by the patient. Immediately prior to procedure a time out was called to verify the correct patient, procedure, equipment, support staff and site/side marked as required. Patient was prepped and draped in the usual sterile fashion.  ? ? ? ? ?Clinical Data: ?No additional findings. ? ? ?Subjective: ?Chief Complaint  ?Patient presents with  ? Right Knee - Pain  ? Right Shoulder - Pain  ? Left Shoulder - Pain  ?Patient presents today right knee pain. She said that she thinks she wants a cortisone injection  today. She is not diabetic. She also has been having bilateral shoulder pain since a fall one year ago. She said that the right is worse than the left. She said that they hurt with use. She said that they are not weak, "but just done work right". She takes over the counter medicine if needed. ? ?HPI ? ?Review of Systems  ?All other systems reviewed and are negative. ? ? ?Objective: ?Vital Signs: There were no vitals taken for this visit. ? ?Physical Exam ?Constitutional:   ?   Appearance: Normal appearance.  ?Pulmonary:  ?   Effort: Pulmonary effort is normal.  ?Skin: ?   General: Skin is warm and dry.  ?Neurological:  ?   Mental Status: She is alert.  ? ? ?Ortho Exam ?Examination of her bilateral shoulders: She has full forward elevation which is equal.  Though more painful in the right.  She has pain in the right greater than left internal rotation behind the back.  She does have positive empty can sign and impingement findings again more significant on the right than the left she has good grip strength and her shoulder strength is 5 out of 5 and equal ?Right knee she has pain in the medial lateral compartments she does have crepitus with range of motion.  She has slight clinical valgus.  Knee is not warm or red no effusion ?  Specialty Comments:  ?No specialty comments available. ? ?Imaging: ?No results found. ? ? ?PMFS History: ?Patient Active Problem List  ? Diagnosis Date Noted  ? Postcholecystectomy diarrhea 04/07/2021  ? Weight loss 02/24/2021  ? Alkaline phosphatase elevation 02/24/2021  ? Shingles rash- Rt Flank 02/16/2020  ? Elevated BP without diagnosis of hypertension 02/15/2020  ? Hyponatremia 02/15/2020  ? Hypokalemia 02/15/2020  ? Symptomatic cholelithiasis--status post lap chole 02/13/20 02/12/2020  ? N&V (nausea and vomiting) 02/12/2020  ? Biliary colic 24/49/7530  ? History of lumbar laminectomy for spinal cord decompression 12/04/2019  ? ?Past Medical History:  ?Diagnosis Date  ? High cholesterol   ?  Hypertension   ? Thyroid disease   ?  ?Family History  ?Problem Relation Age of Onset  ? Heart attack Father   ? Anesthesia problems Daughter   ?     Sensitive to medication/ waking up   ?  ?Past Surgical History:  ?Procedure Laterality Date  ? ABDOMINAL HYSTERECTOMY    ? ANKLE SURGERY Right 2004  ? CHOLECYSTECTOMY N/A 02/13/2020  ? Procedure: LAPAROSCOPIC CHOLECYSTECTOMY;  Surgeon: Virl Cagey, MD;  Location: [facility];  Service: General;  Laterality: N/A;  ? TONSILLECTOMY    ? ?Social History  ? ?Occupational History  ? Not on file  ?Tobacco Use  ? Smoking status: Never  ? Smokeless tobacco: Never  ?Vaping Use  ? Vaping Use: Never used  ?Substance and Sexual Activity  ? Alcohol use: No  ? Drug use: No  ? Sexual activity: Not on file  ? ? ? ? ? ? ?

## 2022-01-04 DIAGNOSIS — L570 Actinic keratosis: Secondary | ICD-10-CM | POA: Diagnosis not present

## 2022-01-04 DIAGNOSIS — I10 Essential (primary) hypertension: Secondary | ICD-10-CM | POA: Diagnosis not present

## 2022-01-04 DIAGNOSIS — M5430 Sciatica, unspecified side: Secondary | ICD-10-CM | POA: Diagnosis not present

## 2022-01-04 DIAGNOSIS — I700 Atherosclerosis of aorta: Secondary | ICD-10-CM | POA: Diagnosis not present

## 2022-01-04 DIAGNOSIS — M545 Low back pain, unspecified: Secondary | ICD-10-CM | POA: Diagnosis not present

## 2022-01-04 DIAGNOSIS — E038 Other specified hypothyroidism: Secondary | ICD-10-CM | POA: Diagnosis not present

## 2022-01-04 DIAGNOSIS — M810 Age-related osteoporosis without current pathological fracture: Secondary | ICD-10-CM | POA: Diagnosis not present

## 2022-01-04 DIAGNOSIS — E7849 Other hyperlipidemia: Secondary | ICD-10-CM | POA: Diagnosis not present

## 2022-01-11 ENCOUNTER — Ambulatory Visit (INDEPENDENT_AMBULATORY_CARE_PROVIDER_SITE_OTHER): Payer: Medicare Other | Admitting: Orthopaedic Surgery

## 2022-01-11 ENCOUNTER — Encounter: Payer: Self-pay | Admitting: Orthopaedic Surgery

## 2022-01-11 DIAGNOSIS — M25511 Pain in right shoulder: Secondary | ICD-10-CM | POA: Diagnosis not present

## 2022-01-11 DIAGNOSIS — G8929 Other chronic pain: Secondary | ICD-10-CM | POA: Diagnosis not present

## 2022-01-11 MED ORDER — METHYLPREDNISOLONE ACETATE 40 MG/ML IJ SUSP
80.0000 mg | INTRAMUSCULAR | Status: AC | PRN
  Administered 2022-01-11: 80 mg via INTRA_ARTICULAR

## 2022-01-11 MED ORDER — BUPIVACAINE HCL 0.25 % IJ SOLN
2.0000 mL | INTRAMUSCULAR | Status: AC | PRN
  Administered 2022-01-11: 2 mL via INTRA_ARTICULAR

## 2022-01-11 NOTE — Progress Notes (Signed)
Office Visit Note   Patient: Olivia Haas           Date of Birth: 09/13/1932           MRN: [id number] Visit Date: 01/11/2022              Requested by: Neale Burly, MD [place],  [place] [address] PCP: Neale Burly, MD   Assessment & Plan: Visit Diagnoses:  1. Chronic right shoulder pain     Plan: Pleasant 88 year old woman who had her right knee injected 2 weeks ago.  She returns today to have her right shoulder injected.  She said the knee injection helped her quite a bit.  She has positive impingement findings we will go forward with right shoulder injection.  She also mentions left shoulder.  She is get a see how she does in the next couple weeks will return for left shoulder injection but if improved may cancel this appointment  Follow-Up Instructions: Return in about 2 weeks (around 01/25/2022).   Orders:  No orders of the defined types were placed in this encounter.  No orders of the defined types were placed in this encounter.     Procedures: Large Joint Inj: R subacromial bursa on 01/11/2022 10:30 AM Indications: diagnostic evaluation and pain Details: 25 G 1.5 in needle, anterior approach  Arthrogram: No  Medications: 80 mg methylPREDNISolone acetate 40 MG/ML; 2 mL bupivacaine 0.25 % Outcome: tolerated well, no immediate complications Procedure, treatment alternatives, risks and benefits explained, specific risks discussed. Consent was given by the patient.     Clinical Data: No additional findings.   Subjective: Chief Complaint  Patient presents with   Right Shoulder - Pain   Left Shoulder - Pain  Patient presents today for her right shoulder. She is wanting to get a cortisone injection. She received a right knee injection two weeks ago and states that it helped a lot.  HPI  Review of Systems  All other systems reviewed and are negative.   Objective: Vital Signs: There were no vitals taken for this visit.  Physical  Exam Constitutional:      Appearance: Normal appearance.  Pulmonary:     Effort: Pulmonary effort is normal.  Skin:    General: Skin is warm and dry.  Neurological:     Mental Status: She is alert.  Psychiatric:        Mood and Affect: Mood normal.        Behavior: Behavior normal.    Ortho Exam Examination of right shoulder she has pain with overhead motion positive impingement findings strength is overall intact.  Most pain is overhead and behind her back.  No redness or swelling in her shoulder Specialty Comments:  No specialty comments available.  Imaging: No results found.   PMFS History: Patient Active Problem List   Diagnosis Date Noted   Pain in right knee 12/28/2021   Pain in right shoulder 12/28/2021   Postcholecystectomy diarrhea 04/07/2021   Weight loss 02/24/2021   Alkaline phosphatase elevation 02/24/2021   Shingles rash- Rt Flank 02/16/2020   Elevated BP without diagnosis of hypertension 02/15/2020   Hyponatremia 02/15/2020   Hypokalemia 02/15/2020   Symptomatic cholelithiasis--status post lap chole 02/13/20 02/12/2020   N&V (nausea and vomiting) 62/13/0865   Biliary colic 78/46/9629   History of lumbar laminectomy for spinal cord decompression 12/04/2019   Past Medical History:  Diagnosis Date   High cholesterol    Hypertension    Thyroid  disease     Family History  Problem Relation Age of Onset   Heart attack Father    Anesthesia problems Daughter        Sensitive to medication/ waking up     Past Surgical History:  Procedure Laterality Date   ABDOMINAL HYSTERECTOMY     ANKLE SURGERY Right 2004   CHOLECYSTECTOMY N/A 02/13/2020   Procedure: LAPAROSCOPIC CHOLECYSTECTOMY;  Surgeon: Virl Cagey, MD;  Location: [facility];  Service: General;  Laterality: N/A;   TONSILLECTOMY     Social History   Occupational History   Not on file  Tobacco Use   Smoking status: Never   Smokeless tobacco: Never  Vaping Use   Vaping Use: Never used   Substance and Sexual Activity   Alcohol use: No   Drug use: No   Sexual activity: Not on file

## 2022-01-25 ENCOUNTER — Encounter: Payer: Self-pay | Admitting: Orthopaedic Surgery

## 2022-01-25 ENCOUNTER — Ambulatory Visit (INDEPENDENT_AMBULATORY_CARE_PROVIDER_SITE_OTHER): Payer: Medicare Other | Admitting: Orthopaedic Surgery

## 2022-01-25 DIAGNOSIS — M25512 Pain in left shoulder: Secondary | ICD-10-CM

## 2022-01-25 DIAGNOSIS — G8929 Other chronic pain: Secondary | ICD-10-CM | POA: Diagnosis not present

## 2022-01-25 MED ORDER — BUPIVACAINE HCL 0.25 % IJ SOLN
2.0000 mL | INTRAMUSCULAR | Status: AC | PRN
  Administered 2022-01-25: 2 mL via INTRA_ARTICULAR

## 2022-01-25 NOTE — Progress Notes (Signed)
Office Visit Note   Patient: Haevyn Ury           Date of Birth: 1933-06-09           MRN: [id number] Visit Date: 01/25/2022              Requested by: Neale Burly, MD [place],  [place] [address] PCP: Neale Burly, MD   Assessment & Plan: Visit Diagnoses:  1. Chronic left shoulder pain     Plan: Mrs. Blizzard has been seen recently for evaluation of bilateral shoulder pain and knee discomfort.  She has had a cortisone injection in her right knee and right shoulder and notes that it is made a big difference.  She still having some trouble with the left shoulder and wanted a cortisone injection today.  She had films recently that did not demonstrate any acute changes.  She could have a chronic rotator cuff tear but has had good strength.  I will proceed with a subacromial cortisone injection and see her back as needed  Follow-Up Instructions: Return if symptoms worsen or fail to improve.   Orders:  No orders of the defined types were placed in this encounter.  No orders of the defined types were placed in this encounter.     Procedures: Large Joint Inj: L subacromial bursa on 01/25/2022 12:08 PM Indications: pain and diagnostic evaluation Details: 25 G 1.5 in needle, anterolateral approach  Arthrogram: No  Medications: 2 mL bupivacaine 0.25 %  12 mg betamethasone injected with Marcaine in the subacromial space left shoulder without problem Consent was given by the patient. Immediately prior to procedure a time out was called to verify the correct patient, procedure, equipment, support staff and site/side marked as required. Patient was prepped and draped in the usual sterile fashion.       Clinical Data: No additional findings.   Subjective: No chief complaint on file. Recently evaluated for bilateral shoulder pain and right knee discomfort.  She has had a cortisone injection in the subacromial region of her right shoulder and right knee  injection both of which made a big difference.  She still having symptoms consistent with impingement syndrome on the left with negative films.  She like to have a cortisone injection in the left shoulder today  HPI  Review of Systems   Objective: Vital Signs: There were no vitals taken for this visit.  Physical Exam Constitutional:      Appearance: She is well-developed.  Pulmonary:     Effort: Pulmonary effort is normal.  Skin:    General: Skin is warm and dry.  Neurological:     Mental Status: She is alert and oriented to person, place, and time.  Psychiatric:        Behavior: Behavior normal.     Ortho Exam left shoulder with positive impingement.  No grating or crepitation.  Appears to have good strength.  Biceps appears to be intact able to place her arm fully overhead with somewhat of a circuitous arc of motion good grip and release  Specialty Comments:  No specialty comments available.  Imaging: No results found.   PMFS History: Patient Active Problem List   Diagnosis Date Noted   Pain in left shoulder 01/25/2022   Pain in right knee 12/28/2021   Pain in right shoulder 12/28/2021   Postcholecystectomy diarrhea 04/07/2021   Weight loss 02/24/2021   Alkaline phosphatase elevation 02/24/2021   Shingles rash- Rt Flank 02/16/2020  Elevated BP without diagnosis of hypertension 02/15/2020   Hyponatremia 02/15/2020   Hypokalemia 02/15/2020   Symptomatic cholelithiasis--status post lap chole 02/13/20 02/12/2020   N&V (nausea and vomiting) 67/67/2094   Biliary colic 70/96/2836   History of lumbar laminectomy for spinal cord decompression 12/04/2019   Past Medical History:  Diagnosis Date   High cholesterol    Hypertension    Thyroid disease     Family History  Problem Relation Age of Onset   Heart attack Father    Anesthesia problems Daughter        Sensitive to medication/ waking up     Past Surgical History:  Procedure Laterality Date   ABDOMINAL  HYSTERECTOMY     ANKLE SURGERY Right 2004   CHOLECYSTECTOMY N/A 02/13/2020   Procedure: LAPAROSCOPIC CHOLECYSTECTOMY;  Surgeon: Virl Cagey, MD;  Location: [facility];  Service: General;  Laterality: N/A;   TONSILLECTOMY     Social History   Occupational History   Not on file  Tobacco Use   Smoking status: Never   Smokeless tobacco: Never  Vaping Use   Vaping Use: Never used  Substance and Sexual Activity   Alcohol use: No   Drug use: No   Sexual activity: Not on file     Garald Balding, MD   Note - This record has been created using Editor, commissioning.  Chart creation errors have been sought, but may not always  have been located. Such creation errors do not reflect on  the standard of medical care.

## 2022-03-22 DIAGNOSIS — M5430 Sciatica, unspecified side: Secondary | ICD-10-CM | POA: Diagnosis not present

## 2022-03-22 DIAGNOSIS — F411 Generalized anxiety disorder: Secondary | ICD-10-CM | POA: Diagnosis not present

## 2022-03-22 DIAGNOSIS — E038 Other specified hypothyroidism: Secondary | ICD-10-CM | POA: Diagnosis not present

## 2022-03-22 DIAGNOSIS — M810 Age-related osteoporosis without current pathological fracture: Secondary | ICD-10-CM | POA: Diagnosis not present

## 2022-03-22 DIAGNOSIS — I10 Essential (primary) hypertension: Secondary | ICD-10-CM | POA: Diagnosis not present

## 2022-03-22 DIAGNOSIS — E7849 Other hyperlipidemia: Secondary | ICD-10-CM | POA: Diagnosis not present

## 2022-03-22 DIAGNOSIS — I700 Atherosclerosis of aorta: Secondary | ICD-10-CM | POA: Diagnosis not present

## 2022-03-22 DIAGNOSIS — L570 Actinic keratosis: Secondary | ICD-10-CM | POA: Diagnosis not present

## 2022-03-22 DIAGNOSIS — M545 Low back pain, unspecified: Secondary | ICD-10-CM | POA: Diagnosis not present

## 2022-03-30 DIAGNOSIS — Z13820 Encounter for screening for osteoporosis: Secondary | ICD-10-CM | POA: Diagnosis not present

## 2022-03-30 DIAGNOSIS — M810 Age-related osteoporosis without current pathological fracture: Secondary | ICD-10-CM | POA: Diagnosis not present

## 2022-05-05 IMAGING — DX DG LUMBAR SPINE COMPLETE 4+V
5 series · 5 of 5 positions shown · non-contrast
Comparison: CT 02/11/2020

CLINICAL DATA: Fall with low back pain

EXAM:
LUMBAR SPINE - COMPLETE 4+ VIEW

[l-spine ap]
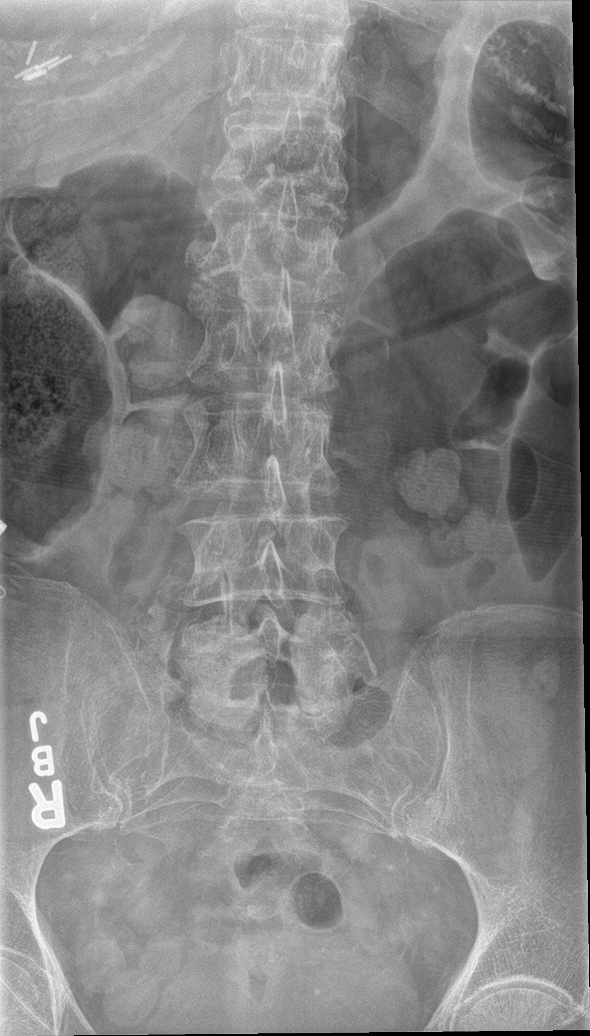

[l-spine obl (1 of 2)]
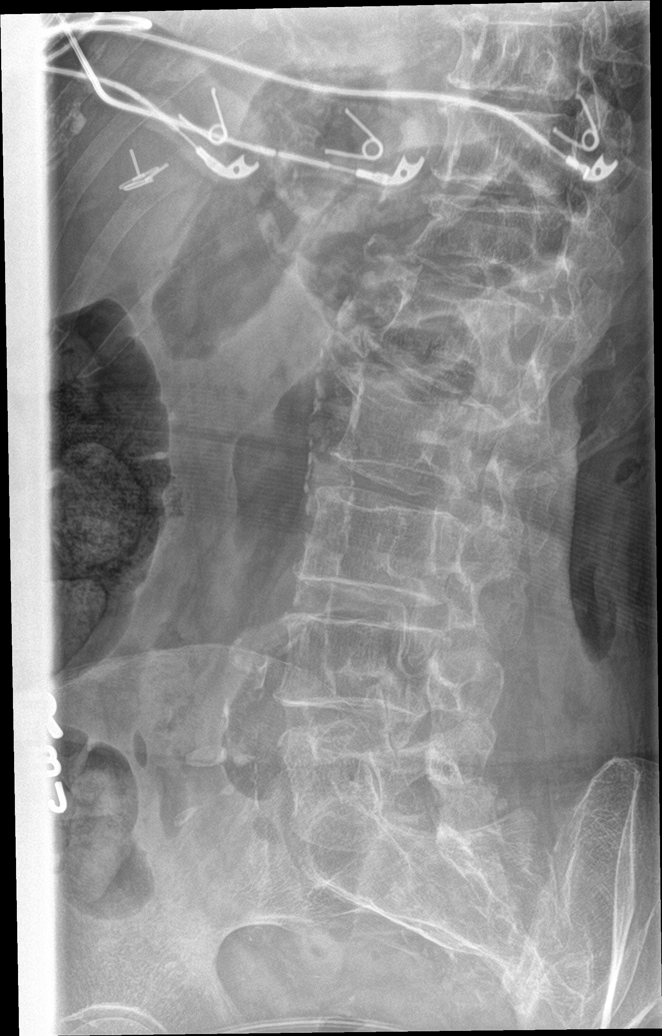

[l-spine obl (2 of 2)]
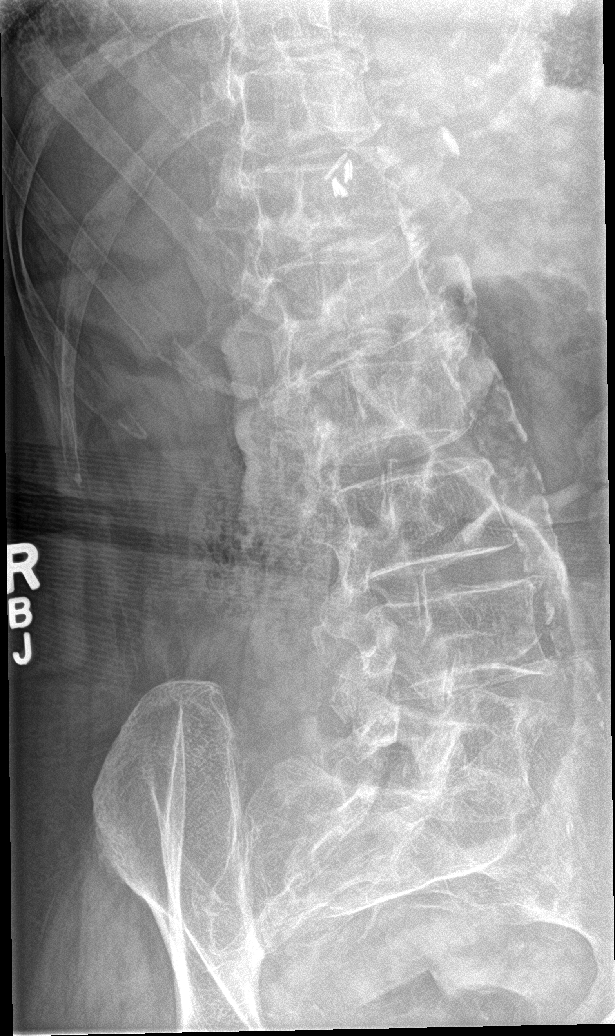

[l-spine lat]
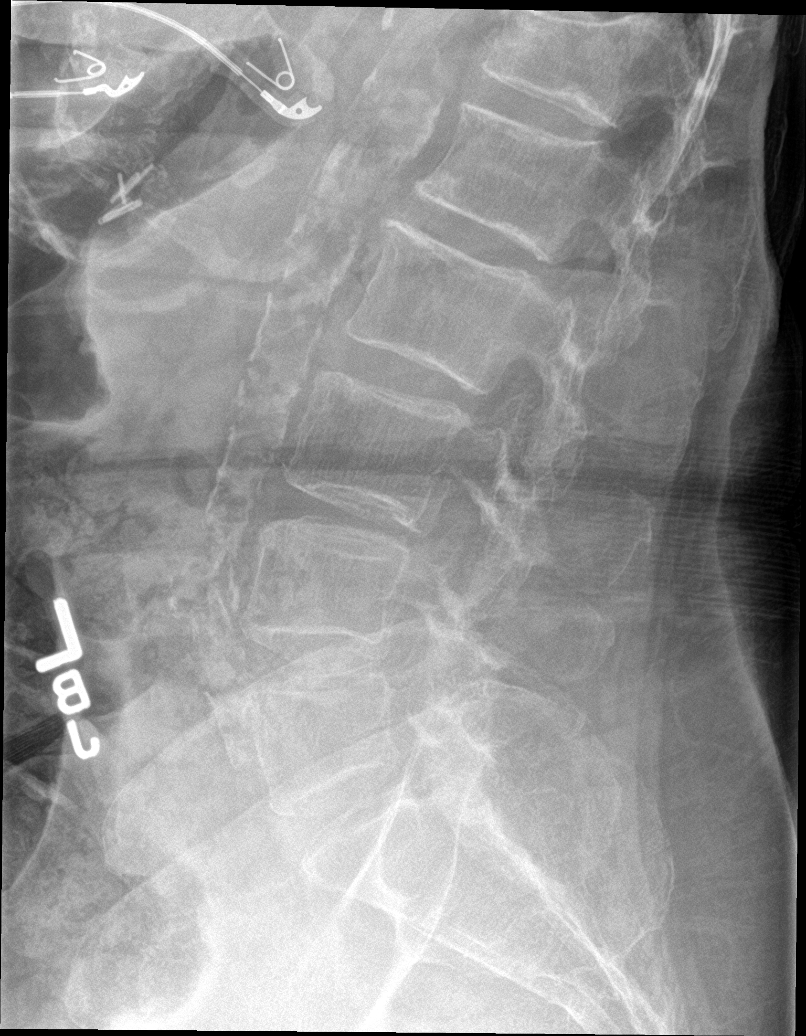

[l-spine spot]
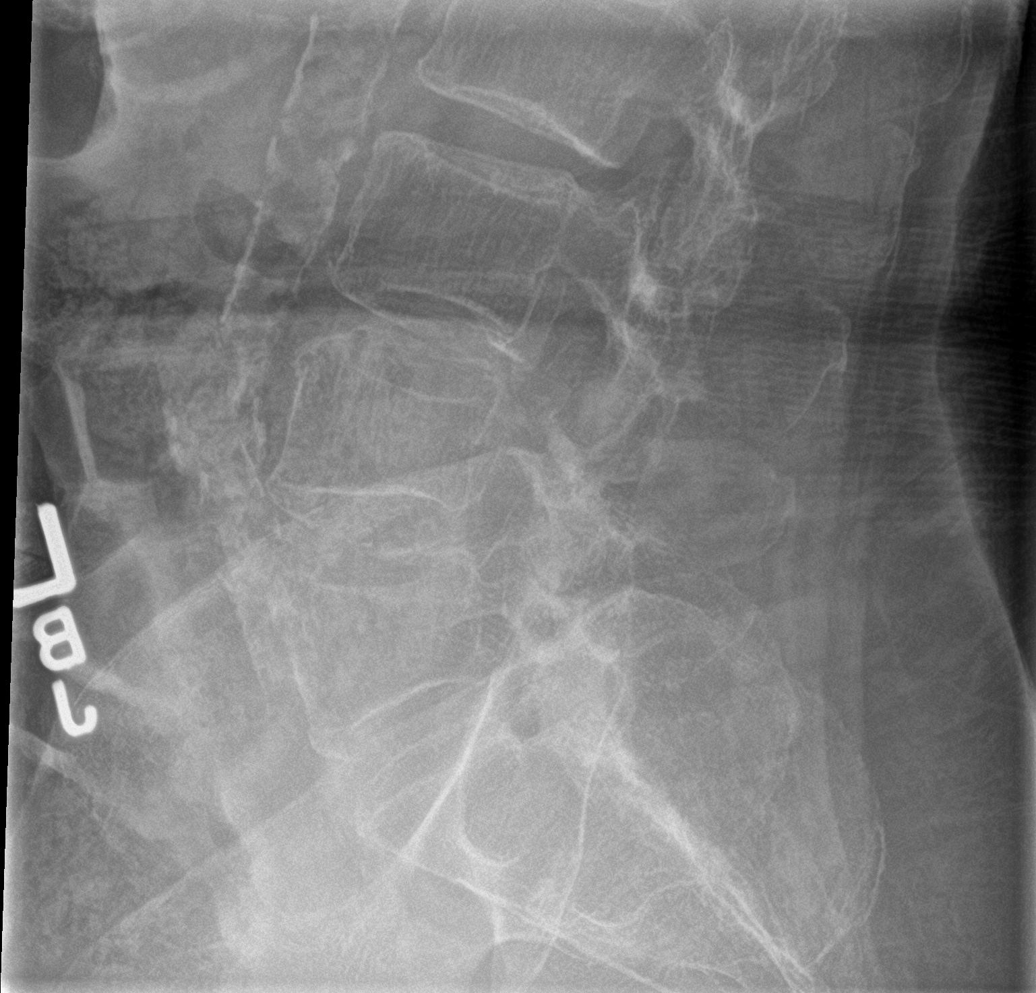

[5 of 5 positions shown; findings below may reference images not displayed]

FINDINGS: Transitional anatomy suspected with 6 non rib-bearing lumbar type
vertebra. For the purposes of reporting, last well-formed lumbar
type vertebra will be designated L5. Lumbar alignment within normal
limits. Incompletely visualized superior endplate deformity at T12.
Aortic atherosclerosis. Disc spaces are patent. Mild facet
degenerative changes of the lower lumbar spine
IMPRESSION: Suspected transitional anatomy as above. Incompletely visualized
superior endplate deformity at T12

## 2022-05-22 ENCOUNTER — Other Ambulatory Visit: Payer: Self-pay | Admitting: Cardiology

## 2022-05-24 DIAGNOSIS — Z23 Encounter for immunization: Secondary | ICD-10-CM | POA: Diagnosis not present

## 2022-06-08 ENCOUNTER — Other Ambulatory Visit (INDEPENDENT_AMBULATORY_CARE_PROVIDER_SITE_OTHER): Payer: Self-pay | Admitting: Internal Medicine

## 2022-06-22 DIAGNOSIS — M353 Polymyalgia rheumatica: Secondary | ICD-10-CM | POA: Diagnosis not present

## 2022-06-22 DIAGNOSIS — F411 Generalized anxiety disorder: Secondary | ICD-10-CM | POA: Diagnosis not present

## 2022-06-22 DIAGNOSIS — E7849 Other hyperlipidemia: Secondary | ICD-10-CM | POA: Diagnosis not present

## 2022-06-22 DIAGNOSIS — I10 Essential (primary) hypertension: Secondary | ICD-10-CM | POA: Diagnosis not present

## 2022-06-22 DIAGNOSIS — M5430 Sciatica, unspecified side: Secondary | ICD-10-CM | POA: Diagnosis not present

## 2022-06-22 DIAGNOSIS — I700 Atherosclerosis of aorta: Secondary | ICD-10-CM | POA: Diagnosis not present

## 2022-06-22 DIAGNOSIS — E038 Other specified hypothyroidism: Secondary | ICD-10-CM | POA: Diagnosis not present

## 2022-06-22 DIAGNOSIS — M545 Low back pain, unspecified: Secondary | ICD-10-CM | POA: Diagnosis not present

## 2022-06-22 DIAGNOSIS — M810 Age-related osteoporosis without current pathological fracture: Secondary | ICD-10-CM | POA: Diagnosis not present

## 2022-06-22 DIAGNOSIS — M7911 Myalgia of mastication muscle: Secondary | ICD-10-CM | POA: Diagnosis not present

## 2022-06-29 ENCOUNTER — Encounter: Payer: Self-pay | Admitting: Cardiology

## 2022-06-29 ENCOUNTER — Ambulatory Visit: Payer: Medicare Other | Attending: Cardiology | Admitting: Cardiology

## 2022-06-29 VITALS — BP 118/76 | HR 71 | Ht 60.0 in | Wt 124.0 lb

## 2022-06-29 DIAGNOSIS — E039 Hypothyroidism, unspecified: Secondary | ICD-10-CM | POA: Diagnosis not present

## 2022-06-29 DIAGNOSIS — I10 Essential (primary) hypertension: Secondary | ICD-10-CM | POA: Diagnosis not present

## 2022-06-29 MED ORDER — AMLODIPINE BESYLATE 2.5 MG PO TABS: ORAL_TABLET | ORAL | 3 refills | Status: DC

## 2022-06-29 NOTE — Progress Notes (Signed)
Clinical Summary Olivia Haas is a 88 y.o.female  seen today for follow up of the following medical problems.       1. HTN - home 120s-130s/60-80s.  - reports some mild LE edema since norvasc change. No SOB/DOE.     - recently on prednisone '5mg'$  daily, unsure of planned duration - 162/80 in the afternoons at home around 2pm. First dose norvasc 1130AM and second dose 430PM  159/80 around 2pm  2. Joint pains - managed by pcp, started on low dose prednisone '5mg'$  daily recently.     Other medical issues not addressed this visit  1.Chest pain - left arm shooting pain. Can have some SOB at times but not specifically associated with arm pain.  - pain can last a few hours. Some improvement with position. Occurs 3-4 times a week - prior to recent fall was very physically, now more sedentary - can some DOE with short distances     CAD risk factors: HTN, hyperlipidemia, father MI 53     02/2021 nuclear stress: no ischemia -chronic MSK pains, no specific chest pains.    Her daughter is [name] one of the stress lab nurses at [employer]   Past Medical History:  Diagnosis Date   High cholesterol    Hypertension    Thyroid disease      Allergies  Allergen Reactions   Cucumber Extract Other (See Comments)    Vomiting   Strawberry (Diagnostic) Other (See Comments)    Vomiting      Current Outpatient Medications  Medication Sig Dispense Refill   acetaminophen (TYLENOL) 325 MG tablet Take 2 tablets (650 mg total) by mouth every 6 (six) hours as needed for mild pain (or temp > 100). 12 tablet 0   amLODipine (NORVASC) 2.5 MG tablet TAKE 1 TABLET BY MOUTH TWICE A DAY 180 tablet 1   atorvastatin (LIPITOR) 20 MG tablet Take 20 mg by mouth every evening.      cholecalciferol (VITAMIN D3) 25 MCG (1000 UNIT) tablet Take 3,000 Units by mouth daily.     cyanocobalamin (,VITAMIN B-12,) 1000 MCG/ML injection cyanocobalamin (vit B-12) 1,000 mcg/mL injection solution  INJECT 1  MILLILITER(1000MCG)BY INTRAMUSCULAR ROUTE ONCE A MONTH     famotidine (PEPCID) 40 MG tablet Take 1 tablet (40 mg total) by mouth at bedtime. 30 tablet 2   levothyroxine (SYNTHROID) 88 MCG tablet Take 88 mcg by mouth every morning.     loperamide (IMODIUM) 2 MG capsule Take 1 capsule (2 mg total) by mouth daily as needed for diarrhea or loose stools.     tizanidine (ZANAFLEX) 2 MG capsule Take 2 mg by mouth at bedtime as needed.     No current facility-administered medications for this visit.     Past Surgical History:  Procedure Laterality Date   ABDOMINAL HYSTERECTOMY     ANKLE SURGERY Right 2004   CHOLECYSTECTOMY N/A 02/13/2020   Procedure: LAPAROSCOPIC CHOLECYSTECTOMY;  Surgeon: Virl Cagey, MD;  Location: [facility];  Service: General;  Laterality: N/A;   TONSILLECTOMY       Allergies  Allergen Reactions   Cucumber Extract Other (See Comments)    Vomiting   Strawberry (Diagnostic) Other (See Comments)    Vomiting       Family History  Problem Relation Age of Onset   Heart attack Father    Anesthesia problems Daughter        Sensitive to medication/ waking up      Social History  Ms. Sofia reports that she has never smoked. She has never used smokeless tobacco. Ms. Longshore reports no history of alcohol use.   Review of Systems CONSTITUTIONAL: No weight loss, fever, chills, weakness or fatigue.  HEENT: Eyes: No visual loss, blurred vision, double vision or yellow sclerae.No hearing loss, sneezing, congestion, runny nose or sore throat.  SKIN: No rash or itching.  CARDIOVASCULAR: per hpi RESPIRATORY: No shortness of breath, cough or sputum.  GASTROINTESTINAL: No anorexia, nausea, vomiting or diarrhea. No abdominal pain or blood.  GENITOURINARY: No burning on urination, no polyuria NEUROLOGICAL: No headache, dizziness, syncope, paralysis, ataxia, numbness or tingling in the extremities. No change in bowel or bladder control.  MUSCULOSKELETAL: No muscle, back  pain, joint pain or stiffness.  LYMPHATICS: No enlarged nodes. No history of splenectomy.  PSYCHIATRIC: No history of depression or anxiety.  ENDOCRINOLOGIC: No reports of sweating, cold or heat intolerance. No polyuria or polydipsia.  Marland Kitchen   Physical Examination Today's Vitals   06/29/22 0856  BP: 118/76  Pulse: 71  SpO2: 96%  Weight: 124 lb (56.2 kg)  Height: 5' (1.524 m)   Body mass index is 24.22 kg/m.  Gen: resting comfortably, no acute distress HEENT: no scleral icterus, pupils equal round and reactive, no palptable cervical adenopathy,  CV: RRR, no m/r/g , no jvd Resp: Clear to auscultation bilaterally GI: abdomen is soft, non-tender, non-distended, normal bowel sounds, no hepatosplenomegaly MSK: extremities are warm, no edema.  Skin: warm, no rash Neuro:  no focal deficits Psych: appropriate affect   Diagnostic Studies   02/2021 nuclear stress There was no ST segment deviation noted during stress. No T wave inversion was noted during stress. The study is normal. There are no perfusion defects This is a low risk study. The left ventricular ejection fraction is hyperdynamic (>65%).  Assessment and Plan   1.HTN - recent elevations at home, unclear if related to recently starting prednisone as just on '5mg'$  daily - will increase AM norvasc to '5mg'$  daily, keep PM dose at 2.5 mg daily. Had some mild LE edema at times that is tolerable, monitor with increased norvasc dose.   2.Hypothryoidism - she has asked for referral to endocrine to help manage, will place referal   Arnoldo Lenis, M.D.

## 2022-06-29 NOTE — Patient Instructions (Addendum)
Medication Instructions:  Your physician has recommended you make the following change in your medication:  Increase Norvasc to 5 mg tablets in the morning and 2.5 mg tablets in the evening.    Labwork: None  Testing/Procedures: None  Follow-Up: Follow up with Dr. Harl Bowie in 6 months.   Any Other Special Instructions Will Be Listed Below (If Applicable).  You have been referred to [facility] Endocrin[facility]- Dr. Dorris Fetch. Their office will call you with your first appointment.    If you need a refill on your cardiac medications before your next appointment, please call your pharmacy.

## 2022-07-10 DIAGNOSIS — M810 Age-related osteoporosis without current pathological fracture: Secondary | ICD-10-CM | POA: Diagnosis not present

## 2022-07-11 DIAGNOSIS — M5451 Vertebrogenic low back pain: Secondary | ICD-10-CM | POA: Diagnosis not present

## 2022-07-19 DIAGNOSIS — M545 Low back pain, unspecified: Secondary | ICD-10-CM | POA: Diagnosis not present

## 2022-07-19 DIAGNOSIS — M6281 Muscle weakness (generalized): Secondary | ICD-10-CM | POA: Diagnosis not present

## 2022-07-21 DIAGNOSIS — M545 Low back pain, unspecified: Secondary | ICD-10-CM | POA: Diagnosis not present

## 2022-07-21 DIAGNOSIS — M6281 Muscle weakness (generalized): Secondary | ICD-10-CM | POA: Diagnosis not present

## 2022-07-25 DIAGNOSIS — M545 Low back pain, unspecified: Secondary | ICD-10-CM | POA: Diagnosis not present

## 2022-07-25 DIAGNOSIS — M6281 Muscle weakness (generalized): Secondary | ICD-10-CM | POA: Diagnosis not present

## 2022-07-28 DIAGNOSIS — M6281 Muscle weakness (generalized): Secondary | ICD-10-CM | POA: Diagnosis not present

## 2022-07-28 DIAGNOSIS — M545 Low back pain, unspecified: Secondary | ICD-10-CM | POA: Diagnosis not present

## 2022-08-01 DIAGNOSIS — M545 Low back pain, unspecified: Secondary | ICD-10-CM | POA: Diagnosis not present

## 2022-08-01 DIAGNOSIS — M6281 Muscle weakness (generalized): Secondary | ICD-10-CM | POA: Diagnosis not present

## 2022-08-09 DIAGNOSIS — M6281 Muscle weakness (generalized): Secondary | ICD-10-CM | POA: Diagnosis not present

## 2022-08-09 DIAGNOSIS — M545 Low back pain, unspecified: Secondary | ICD-10-CM | POA: Diagnosis not present

## 2022-08-16 DIAGNOSIS — M6281 Muscle weakness (generalized): Secondary | ICD-10-CM | POA: Diagnosis not present

## 2022-08-16 DIAGNOSIS — M545 Low back pain, unspecified: Secondary | ICD-10-CM | POA: Diagnosis not present

## 2022-09-06 ENCOUNTER — Ambulatory Visit (INDEPENDENT_AMBULATORY_CARE_PROVIDER_SITE_OTHER): Payer: Medicare Other | Admitting: "Endocrinology

## 2022-09-06 ENCOUNTER — Encounter: Payer: Self-pay | Admitting: "Endocrinology

## 2022-09-06 VITALS — BP 122/72 | HR 88 | Ht 60.0 in | Wt 127.2 lb

## 2022-09-06 DIAGNOSIS — E782 Mixed hyperlipidemia: Secondary | ICD-10-CM | POA: Diagnosis not present

## 2022-09-06 DIAGNOSIS — E039 Hypothyroidism, unspecified: Secondary | ICD-10-CM

## 2022-09-06 NOTE — Progress Notes (Unsigned)
Endocrinology Consult Note                                            09/06/2022, 7:47 PM   Subjective:    Patient ID: Olivia Haas, female    DOB: [birth year], PCP Neale Burly, MD   Past Medical History:  Diagnosis Date   High cholesterol    Hypertension    Osteoporosis    Thyroid disease    Past Surgical History:  Procedure Laterality Date   ABDOMINAL HYSTERECTOMY     ANKLE SURGERY Right 2004   CHOLECYSTECTOMY N/A 02/13/2020   Procedure: LAPAROSCOPIC CHOLECYSTECTOMY;  Surgeon: Olivia Cagey, MD;  Location: [facility];  Service: General;  Laterality: N/A;   TONSILLECTOMY     Social History   Socioeconomic History   Marital status: Widowed    Spouse name: Not on file   Number of children: Not on file   Years of education: Not on file   Highest education level: Not on file  Occupational History   Not on file  Tobacco Use   Smoking status: Never   Smokeless tobacco: Never  Vaping Use   Vaping Use: Never used  Substance and Sexual Activity   Alcohol use: No   Drug use: No   Sexual activity: Not on file  Other Topics Concern   Not on file  Social History Narrative   Not on file   Social Determinants of Health   Financial Resource Strain: Not on file  Food Insecurity: Not on file  Transportation Needs: Not on file  Physical Activity: Not on file  Stress: Not on file  Social Connections: Not on file   Family History  Problem Relation Age of Onset   Heart attack Father    Anesthesia problems Daughter        Sensitive to medication/ waking up    Outpatient Encounter Medications as of 09/06/2022  Medication Sig   acetaminophen (TYLENOL) 325 MG tablet Take 2 tablets (650 mg total) by mouth every 6 (six) hours as needed for mild pain (or temp > 100).   amLODipine (NORVASC) 2.5 MG tablet Take 2 tablets (5 mg total) by mouth every morning AND 1 tablet (2.5 mg total) every evening.   atorvastatin (LIPITOR) 20 MG tablet Take 20 mg by mouth every  evening.    cholecalciferol (VITAMIN D3) 25 MCG (1000 UNIT) tablet Take 3,000 Units by mouth daily.   cyanocobalamin (,VITAMIN B-12,) 1000 MCG/ML injection cyanocobalamin (vit B-12) 1,000 mcg/mL injection solution  INJECT 1 MILLILITER(1000MCG)BY INTRAMUSCULAR ROUTE ONCE A MONTH   famotidine (PEPCID) 40 MG tablet Take 1 tablet (40 mg total) by mouth at bedtime. (Patient not taking: Reported on 09/06/2022)   levothyroxine (SYNTHROID) 88 MCG tablet Take 88 mcg by mouth every morning.   loperamide (IMODIUM) 2 MG capsule Take 1 capsule (2 mg total) by mouth daily as needed for diarrhea or loose stools.   tizanidine (ZANAFLEX) 2 MG capsule Take 2 mg by mouth at bedtime as needed. (Patient not taking: Reported on 09/06/2022)   No facility-administered encounter medications on file as of 09/06/2022.   ALLERGIES: Allergies  Allergen Reactions   Cucumber Extract Other (See Comments)    Vomiting   Strawberry (Diagnostic) Other (See Comments)    Vomiting     VACCINATION STATUS: Immunization History  Administered Date(s) Administered  Influenza-Unspecified 08/14/2018    HPI Olivia Haas is 89 y.o. female who presents today with a medical history as above. she is being seen in consultation for hypothyroidism requested by Neale Burly, MD.  Patient is a suboptimal historian.  She gives a history of being diagnosed with hypothyroidism approximately at age 57 years.  She has taken various doses of levothyroxine over the years.  She is currently on levothyroxine 88 mcg p.o. daily before breakfast.  She takes this medication correctly.  She does not have recent thyroid function tests.  Her last labs were in August which showed TSH of 0.727. Her other medical problems include hypertension and hyperlipidemia on treatment.  She denies any acute symptoms at this time, denies palpitations, tremor, nor heat intolerance.  She denies dysphagia, shortness of breath, nor voice change. She reports family history  of thyroid dysfunction in one of her sisters, however no family history of thyroid malignancy.    Review of Systems  Constitutional: + Progressively gaining weight, however her BMI is still good at 24.8.  No fatigue, no subjective hyperthermia, no subjective hypothermia Eyes: no blurry vision, no xerophthalmia ENT: no sore throat, no nodules palpated in throat, no dysphagia/odynophagia, no hoarseness Cardiovascular: no Chest Pain, no Shortness of Breath, no palpitations, no leg swelling Respiratory: no cough, no shortness of breath Gastrointestinal: no Nausea/Vomiting/Diarhhea Musculoskeletal: no muscle/joint aches Skin: no rashes Neurological: no tremors, no numbness, no tingling, no dizziness Psychiatric: no depression, no anxiety  Objective:       09/06/2022    1:14 PM 06/29/2022    8:56 AM 12/28/2021    9:24 AM  Vitals with BMI  Height '5\' 0"'$  '5\' 0"'$  '5\' 0"'$   Weight 127 lbs 3 oz 124 lbs 123 lbs  BMI 24.84 35.00 93.81  Systolic 829 937   Diastolic 72 76   Pulse 88 71     BP 122/72   Pulse 88   Ht 5' (1.524 m)   Wt 127 lb 3.2 oz (57.7 kg)   BMI 24.84 kg/m   Wt Readings from Last 3 Encounters:  09/06/22 127 lb 3.2 oz (57.7 kg)  06/29/22 124 lb (56.2 kg)  12/28/21 123 lb (55.8 kg)    Physical Exam  Constitutional:  Body mass index is 24.84 kg/m.,  not in acute distress, normal state of mind Eyes: PERRLA, EOMI, no exophthalmos ENT: moist mucous membranes, no gross thyromegaly, no gross cervical lymphadenopathy Cardiovascular: normal precordial activity, Regular Rate and Rhythm, no Murmur/Rubs/Gallops Respiratory:  adequate breathing efforts, no gross chest deformity, Clear to auscultation bilaterally Gastrointestinal: abdomen soft, Non -tender, No distension, Bowel Sounds present, no gross organomegaly Musculoskeletal: no gross deformities, strength intact in all four extremities Skin: moist, warm, no rashes Neurological: no tremor with outstretched hands, Deep tendon  reflexes normal in bilateral lower extremities.  CMP ( most recent) CMP     Component Value Date/Time   NA 139 02/24/2021 1505   K 4.6 02/24/2021 1505   CL 105 02/24/2021 1505   CO2 25 02/24/2021 1505   GLUCOSE 101 02/24/2021 1505   BUN 16 02/24/2021 1505   CREATININE 0.73 02/24/2021 1505   CALCIUM 10.0 02/24/2021 1505   PROT 6.7 02/24/2021 1505   ALBUMIN 5.1 (H) 01/06/2021 1540   AST 14 02/24/2021 1505   ALT 12 02/24/2021 1505   ALKPHOS 141 (H) 01/06/2021 1540   BILITOT 0.3 02/24/2021 1505   GFRNONAA >60 01/06/2021 1540   GFRAA >60 02/16/2020 0444   TSH on March 26, 2022 was 0.727.     Assessment & Plan:   1. Hypothyroidism, unspecified type  - Khira Wallington  is being seen at a kind request of Hasanaj, Samul Dada, MD. - I have reviewed her available thyroid records and clinically evaluated the patient. - Based on these reviews, she has hypothyroidism on levothyroxine,  however,  there is not sufficient recent information to proceed with definitive treatment plan. She is advised to continue her current dose of levothyroxine 88 mcg p.o. daily before breakfast.  - We discussed about the correct intake of her thyroid hormone, on empty stomach at fasting, with water, separated by at least 30 minutes from breakfast and other medications,  and separated by more than 4 hours from calcium, iron, multivitamins, acid reflux medications (PPIs). -Patient is made aware of the fact that thyroid hormone replacement is needed for life, dose to be adjusted by periodic monitoring of thyroid function tests. She will be sent for new set of labs for thyroid function including TSH, free T4 and antithyroid antibodies. Her last lipid panel showed LDL of 136, advised to be consistent with her atorvastatin 20 mg p.o. nightly.  Side effects and precautions discussed with her.  - I did not initiate any new prescriptions today. - she is advised to maintain close follow up with Neale Burly, MD for  primary care needs.  - Time spent with the patient: 45 minutes, of which >50% was spent in  counseling her about her hypothyroidism, hyperlipidemia and the rest in obtaining information about her symptoms, reviewing her previous labs/studies ( including abstractions from other facilities),  evaluations, and treatments,  and developing a plan to confirm diagnosis and long term treatment based on the latest standards of care/guidelines; and documenting her care.  Olivia Haas participated in the discussions, expressed understanding, and voiced agreement with the above plans.  All questions were answered to her satisfaction. she is encouraged to contact clinic should she have any questions or concerns prior to her return visit.  Follow up plan: Return in about 1 week (around 09/13/2022) for Labs Today- Non-Fasting Ok, Thyroid / Neck Ultrasound.   Glade Lloyd, MD [facility] [address] [place], [place] [address] Phone: [phone]  Fax: [phone]     09/06/2022, 7:47 PM  This note was partially dictated with voice recognition software. Similar sounding words can be transcribed inadequately or may not  be corrected upon review.

## 2022-09-07 DIAGNOSIS — E782 Mixed hyperlipidemia: Secondary | ICD-10-CM | POA: Insufficient documentation

## 2022-09-07 LAB — THYROGLOBULIN ANTIBODY: Thyroglobulin Antibody: <1 IU/mL (ref 0.0–0.9)

## 2022-09-07 LAB — TSH: TSH: 2.92 u[IU]/mL (ref 0.450–4.500)

## 2022-09-07 LAB — THYROID PEROXIDASE ANTIBODY: Thyroperoxidase Ab SerPl-aCnc: <9 IU/mL (ref 0–34)

## 2022-09-07 LAB — VITAMIN B12: Vitamin B-12: 629 pg/mL (ref 232–1245)

## 2022-09-07 LAB — T4, FREE: Free T4: 1.42 ng/dL (ref 0.82–1.77)

## 2022-09-08 ENCOUNTER — Telehealth: Payer: Self-pay | Admitting: "Endocrinology

## 2022-09-08 NOTE — Telephone Encounter (Signed)
Pt left a VM stating she has not heard from ultrasound dept and she has a follow up with Dr Dorris Fetch 1/31. Can you check on this?

## 2022-09-11 NOTE — Telephone Encounter (Signed)
Thank you so much! I was able to move her appt for [place] out a little bit

## 2022-09-13 ENCOUNTER — Ambulatory Visit: Payer: Medicare Other | Admitting: "Endocrinology

## 2022-09-18 ENCOUNTER — Ambulatory Visit (HOSPITAL_COMMUNITY)
Admission: RE | Admit: 2022-09-18 | Discharge: 2022-09-18 | Disposition: A | Discharge status: Home / Self Care | Payer: Medicare Other | Source: Ambulatory Visit | Attending: "Endocrinology | Admitting: "Endocrinology

## 2022-09-18 DIAGNOSIS — E039 Hypothyroidism, unspecified: Secondary | ICD-10-CM | POA: Diagnosis not present

## 2022-09-19 DIAGNOSIS — M5430 Sciatica, unspecified side: Secondary | ICD-10-CM | POA: Diagnosis not present

## 2022-09-19 DIAGNOSIS — M810 Age-related osteoporosis without current pathological fracture: Secondary | ICD-10-CM | POA: Diagnosis not present

## 2022-09-19 DIAGNOSIS — M353 Polymyalgia rheumatica: Secondary | ICD-10-CM | POA: Diagnosis not present

## 2022-09-19 DIAGNOSIS — F411 Generalized anxiety disorder: Secondary | ICD-10-CM | POA: Diagnosis not present

## 2022-09-19 DIAGNOSIS — I700 Atherosclerosis of aorta: Secondary | ICD-10-CM | POA: Diagnosis not present

## 2022-09-19 DIAGNOSIS — I10 Essential (primary) hypertension: Secondary | ICD-10-CM | POA: Diagnosis not present

## 2022-09-19 DIAGNOSIS — M7911 Myalgia of mastication muscle: Secondary | ICD-10-CM | POA: Diagnosis not present

## 2022-09-19 DIAGNOSIS — E7849 Other hyperlipidemia: Secondary | ICD-10-CM | POA: Diagnosis not present

## 2022-09-19 DIAGNOSIS — L570 Actinic keratosis: Secondary | ICD-10-CM | POA: Diagnosis not present

## 2022-09-19 DIAGNOSIS — M545 Low back pain, unspecified: Secondary | ICD-10-CM | POA: Diagnosis not present

## 2022-09-19 DIAGNOSIS — E038 Other specified hypothyroidism: Secondary | ICD-10-CM | POA: Diagnosis not present

## 2022-09-25 ENCOUNTER — Ambulatory Visit (INDEPENDENT_AMBULATORY_CARE_PROVIDER_SITE_OTHER): Payer: Medicare Other | Admitting: "Endocrinology

## 2022-09-25 ENCOUNTER — Encounter: Payer: Self-pay | Admitting: "Endocrinology

## 2022-09-25 VITALS — BP 92/58 | HR 84 | Ht 60.0 in | Wt 126.8 lb

## 2022-09-25 DIAGNOSIS — E039 Hypothyroidism, unspecified: Secondary | ICD-10-CM

## 2022-09-25 DIAGNOSIS — I10 Essential (primary) hypertension: Secondary | ICD-10-CM | POA: Diagnosis not present

## 2022-09-25 DIAGNOSIS — E782 Mixed hyperlipidemia: Secondary | ICD-10-CM | POA: Diagnosis not present

## 2022-09-25 MED ORDER — AMLODIPINE BESYLATE 2.5 MG PO TABS: 2.5000 mg | ORAL_TABLET | Freq: Every day | ORAL | 1 refills | Status: DC

## 2022-09-25 NOTE — Progress Notes (Signed)
09/25/2022, 11:40 AM   Endocrinology follow-up note  Subjective:    Patient ID: Olivia Haas, female    DOB: Feb 16, 1933, PCP Neale Burly, Haas   Past Medical History:  Diagnosis Date   High cholesterol    Hypertension    Osteoporosis    Thyroid disease    Past Surgical History:  Procedure Laterality Date   ABDOMINAL HYSTERECTOMY     ANKLE SURGERY Right 2004   CHOLECYSTECTOMY N/A 02/13/2020   Procedure: LAPAROSCOPIC CHOLECYSTECTOMY;  Surgeon: Olivia Haas;  Location: [facility];  Service: General;  Laterality: N/A;   TONSILLECTOMY     Social History   Socioeconomic History   Marital status: Widowed    Spouse name: Not on file   Number of children: Not on file   Years of education: Not on file   Highest education level: Not on file  Occupational History   Not on file  Tobacco Use   Smoking status: Never   Smokeless tobacco: Never  Vaping Use   Vaping Use: Never used  Substance and Sexual Activity   Alcohol use: No   Drug use: No   Sexual activity: Not on file  Other Topics Concern   Not on file  Social History Narrative   Not on file   Social Determinants of Health   Financial Resource Strain: Not on file  Food Insecurity: Not on file  Transportation Needs: Not on file  Physical Activity: Not on file  Stress: Not on file  Social Connections: Not on file   Family History  Problem Relation Age of Onset   Heart attack Father    Anesthesia problems Daughter        Sensitive to medication/ waking up    Outpatient Encounter Medications as of 09/25/2022  Medication Sig   acetaminophen (TYLENOL) 325 MG tablet Take 2 tablets (650 mg total) by mouth every 6 (six) hours as needed for mild pain (or temp > 100).   amLODipine (NORVASC) 2.5 MG tablet Take 1 tablet (2.5 mg total) by mouth daily with breakfast.   atorvastatin (LIPITOR) 20 MG tablet Take 20 mg by mouth every evening.    cholecalciferol  (VITAMIN D3) 25 MCG (1000 UNIT) tablet Take 3,000 Units by mouth daily.   cyanocobalamin (,VITAMIN B-12,) 1000 MCG/ML injection cyanocobalamin (vit B-12) 1,000 mcg/mL injection solution  INJECT 1 MILLILITER(1000MCG)BY INTRAMUSCULAR ROUTE ONCE A MONTH   levothyroxine (SYNTHROID) 88 MCG tablet Take 88 mcg by mouth every morning.   loperamide (IMODIUM) 2 MG capsule Take 1 capsule (2 mg total) by mouth daily as needed for diarrhea or loose stools.   [DISCONTINUED] amLODipine (NORVASC) 2.5 MG tablet Take 2 tablets (5 mg total) by mouth every morning AND 1 tablet (2.5 mg total) every evening.   [DISCONTINUED] famotidine (PEPCID) 40 MG tablet Take 1 tablet (40 mg total) by mouth at bedtime. (Patient not taking: Reported on 09/06/2022)   [DISCONTINUED] tizanidine (ZANAFLEX) 2 MG capsule Take 2 mg by mouth at bedtime as needed. (Patient not taking: Reported on 09/06/2022)   No facility-administered encounter medications on file as of 09/25/2022.   ALLERGIES: Allergies  Allergen Reactions   Cucumber Extract Other (See Comments)    Vomiting  Strawberry (Diagnostic) Other (See Comments)    Vomiting     VACCINATION STATUS: Immunization History  Administered Date(s) Administered   Influenza-Unspecified 08/14/2018    HPI Olivia Haas is 89 y.o. female who presents today with a medical history as above. she is being seen in follow-up after she was seen in consultation for hypothyroidism requested by Neale Burly, Haas.  Patient is a suboptimal historian.  She gives a history of being diagnosed with hypothyroidism approximately at age 33 years.  She has taken various doses of levothyroxine over the years.  She is currently on levothyroxine 88 mcg p.o. daily before breakfast.  She takes this medication correctly.  Her most recent thyroid function test are consistent with appropriate replacement.  Her other medical problems include hypertension and hyperlipidemia on treatment.  She denies any acute  symptoms at this time, denies palpitations, tremor, nor heat intolerance.  She denies dysphagia, shortness of breath, nor voice change. She reports family history of thyroid dysfunction in one of her sisters, however no family history of thyroid malignancy.   She was found to have symptomatic hypotension this morning.  She reports occasional lightheadedness and dizziness.  She is on amlodipine 7.5 mg p.o. daily.  Review of Systems  Constitutional: + Progressively gaining weight, however her BMI is still good at 24.8.  No fatigue, no subjective hyperthermia, no subjective hypothermia Eyes: no blurry vision, no xerophthalmia   Objective:       09/25/2022   10:44 AM 09/06/2022    1:14 PM 06/29/2022    8:56 AM  Vitals with BMI  Height 5' 0"$  5' 0"$  5' 0"$   Weight 126 lbs 13 oz 127 lbs 3 oz 124 lbs  BMI 24.76 [id number]  Systolic 92 [id number]  Diastolic 58 72 76  Pulse 84 88 71    BP (!) 92/58   Pulse 84   Ht 5' (1.524 m)   Wt 126 lb 12.8 oz (57.5 kg)   BMI 24.76 kg/m   Wt Readings from Last 3 Encounters:  09/25/22 126 lb 12.8 oz (57.5 kg)  09/06/22 127 lb 3.2 oz (57.7 kg)  06/29/22 124 lb (56.2 kg)    Physical Exam  Constitutional:  Body mass index is 24.76 kg/m.,  not in acute distress, normal state of mind Eyes: PERRLA, EOMI, no exophthalmos  CMP ( most recent) CMP     Component Value Date/Time   NA 139 02/24/2021 1505   K 4.6 02/24/2021 1505   CL 105 02/24/2021 1505   CO2 25 02/24/2021 1505   GLUCOSE 101 02/24/2021 1505   BUN 16 02/24/2021 1505   CREATININE 0.73 02/24/2021 1505   CALCIUM 10.0 02/24/2021 1505   PROT 6.7 02/24/2021 1505   ALBUMIN 5.1 (H) 01/06/2021 1540   AST 14 02/24/2021 1505   ALT 12 02/24/2021 1505   ALKPHOS 141 (H) 01/06/2021 1540   BILITOT 0.3 02/24/2021 1505   GFRNONAA >60 01/06/2021 1540   GFRAA >60 02/16/2020 0444   TSH on March 26, 2022 was 0.727.  Recent Results (from the past 2160 hour(s))  TSH     Status: None   Collection  Time: 09/06/22  2:07 PM  Result Value Ref Range   TSH 2.920 0.450 - 4.500 uIU/mL  T4, free     Status: None   Collection Time: 09/06/22  2:07 PM  Result Value Ref Range   Free T4 1.42 0.82 - 1.77 ng/dL  Thyroid peroxidase antibody     Status: None  Collection Time: 09/06/22  2:07 PM  Result Value Ref Range   Thyroperoxidase Ab SerPl-aCnc <9 0 - 34 IU/mL  Thyroglobulin antibody     Status: None   Collection Time: 09/06/22  2:07 PM  Result Value Ref Range   Thyroglobulin Antibody <1.0 0.0 - 0.9 IU/mL    Comment: Thyroglobulin Antibody measured by Beckman Coulter Methodology  Vitamin B12     Status: None   Collection Time: 09/06/22  2:07 PM  Result Value Ref Range   Vitamin B-12 629 232 - 1,245 pg/mL      Assessment & Plan:   1. Hypothyroidism,  2.  Hypertension  3.  Hyperlipidemia  - I have reviewed her new and available thyroid records and clinically evaluated the patient. - Based on these reviews, she has hypothyroidism on levothyroxine. Her thyroid function tests are consistent with appropriate replacement.  She is advised to continue levothyroxine 88 mcg p.o. daily before breakfast.  - We discussed about the correct intake of her thyroid hormone, on empty stomach at fasting, with water, separated by at least 30 minutes from breakfast and other medications,  and separated by more than 4 hours from calcium, iron, multivitamins, acid reflux medications (PPIs). -Patient is made aware of the fact that thyroid hormone replacement is needed for life, dose to be adjusted by periodic monitoring of thyroid function tests.  Her last lipid panel showed LDL of 136, advised to be consistent with her atorvastatin 20 mg p.o. nightly.  Side effects and precautions discussed with her.  In light of her presentation with symptomatic hypotension, I discussed and lowered her amlodipine to 2.5 mg p.o. daily at breakfast. She has upcoming cardiology and primary care visits where she has a chance for  measurement of her blood pressure.  - I did not initiate any new prescriptions today. - she is advised to maintain close follow up with Neale Burly, Haas for primary care needs.   I spent  21  minutes in the care of the patient today including review of labs from Thyroid Function, CMP, and other relevant labs ; imaging/biopsy records (current and previous including abstractions from other facilities); face-to-face time discussing  her lab results and symptoms, medications doses, her options of short and long term treatment based on the latest standards of care / guidelines;   and documenting the encounter.  Olivia Haas  participated in the discussions, expressed understanding, and voiced agreement with the above plans.  All questions were answered to her satisfaction. she is encouraged to contact clinic should she have any questions or concerns prior to her return visit.   Follow up plan: Return in about 6 months (around 03/26/2023) for Fasting Labs  in AM B4 8.   Glade Lloyd, Haas [facility] [address] [place], [place] [address] Phone: [phone]  Fax: [phone]     09/25/2022, 11:40 AM  This note was partially dictated with voice recognition software. Similar sounding words can be transcribed inadequately or may not  be corrected upon review.

## 2022-10-12 ENCOUNTER — Encounter: Payer: Self-pay | Admitting: Radiology

## 2022-12-18 DIAGNOSIS — I700 Atherosclerosis of aorta: Secondary | ICD-10-CM | POA: Diagnosis not present

## 2022-12-18 DIAGNOSIS — M5430 Sciatica, unspecified side: Secondary | ICD-10-CM | POA: Diagnosis not present

## 2022-12-18 DIAGNOSIS — M810 Age-related osteoporosis without current pathological fracture: Secondary | ICD-10-CM | POA: Diagnosis not present

## 2022-12-18 DIAGNOSIS — E038 Other specified hypothyroidism: Secondary | ICD-10-CM | POA: Diagnosis not present

## 2022-12-18 DIAGNOSIS — M545 Low back pain, unspecified: Secondary | ICD-10-CM | POA: Diagnosis not present

## 2022-12-18 DIAGNOSIS — M7911 Myalgia of mastication muscle: Secondary | ICD-10-CM | POA: Diagnosis not present

## 2022-12-18 DIAGNOSIS — I10 Essential (primary) hypertension: Secondary | ICD-10-CM | POA: Diagnosis not present

## 2022-12-18 DIAGNOSIS — Z1331 Encounter for screening for depression: Secondary | ICD-10-CM | POA: Diagnosis not present

## 2022-12-18 DIAGNOSIS — F411 Generalized anxiety disorder: Secondary | ICD-10-CM | POA: Diagnosis not present

## 2022-12-18 DIAGNOSIS — E7849 Other hyperlipidemia: Secondary | ICD-10-CM | POA: Diagnosis not present

## 2022-12-18 DIAGNOSIS — Z0000 Encounter for general adult medical examination without abnormal findings: Secondary | ICD-10-CM | POA: Diagnosis not present

## 2022-12-18 DIAGNOSIS — M353 Polymyalgia rheumatica: Secondary | ICD-10-CM | POA: Diagnosis not present

## 2022-12-20 ENCOUNTER — Ambulatory Visit: Payer: Medicare Other | Attending: Cardiology | Admitting: Cardiology

## 2022-12-20 ENCOUNTER — Encounter: Payer: Self-pay | Admitting: Cardiology

## 2022-12-20 VITALS — BP 106/68 | HR 72 | Ht 60.0 in | Wt 128.0 lb

## 2022-12-20 DIAGNOSIS — E782 Mixed hyperlipidemia: Secondary | ICD-10-CM | POA: Insufficient documentation

## 2022-12-20 DIAGNOSIS — I10 Essential (primary) hypertension: Secondary | ICD-10-CM | POA: Diagnosis not present

## 2022-12-20 MED ORDER — AMLODIPINE BESYLATE 2.5 MG PO TABS: ORAL_TABLET | ORAL | 3 refills | Status: DC

## 2022-12-20 NOTE — Progress Notes (Signed)
Clinical Summary Olivia Haas is a 89 y.o.female seen today for follow up of the following medical problems.        1. HTN - Home bp's 130s-140s/60s-80s - Norvasc 5mg  in AM and 2.5mg     2. Joint pains - managed by pcp   3.Hyperlipidemia - compliant with statin     Other medical issues not addressed this visit   1.Chest pain - left arm shooting pain. Can have some SOB at times but not specifically associated with arm pain.  - pain can last a few hours. Some improvement with position. Occurs 3-4 times a week - prior to recent fall was very physically, now more sedentary - can some DOE with short distances     CAD risk factors: HTN, hyperlipidemia, father MI 55     02/2021 nuclear stress: no ischemia -chronic MSK pains, no specific chest pains.      Her daughter is Olivia Haas one of the stress lab nurses at [employer] Past Medical History:  Diagnosis Date   High cholesterol    Hypertension    Osteoporosis    Thyroid disease      Allergies  Allergen Reactions   Cucumber Extract Other (See Comments)    Vomiting   Strawberry (Diagnostic) Other (See Comments)    Vomiting      Current Outpatient Medications  Medication Sig Dispense Refill   acetaminophen (TYLENOL) 325 MG tablet Take 2 tablets (650 mg total) by mouth every 6 (six) hours as needed for mild pain (or temp > 100). 12 tablet 0   amLODipine (NORVASC) 2.5 MG tablet Take 1 tablet (2.5 mg total) by mouth daily with breakfast. 90 tablet 1   atorvastatin (LIPITOR) 20 MG tablet Take 20 mg by mouth every evening.      cholecalciferol (VITAMIN D3) 25 MCG (1000 UNIT) tablet Take 3,000 Units by mouth daily.     cyanocobalamin (,VITAMIN B-12,) 1000 MCG/ML injection cyanocobalamin (vit B-12) 1,000 mcg/mL injection solution  INJECT 1 MILLILITER(1000MCG)BY INTRAMUSCULAR ROUTE ONCE A MONTH     levothyroxine (SYNTHROID) 88 MCG tablet Take 88 mcg by mouth every morning.     loperamide (IMODIUM) 2 MG capsule Take 1  capsule (2 mg total) by mouth daily as needed for diarrhea or loose stools.     No current facility-administered medications for this visit.     Past Surgical History:  Procedure Laterality Date   ABDOMINAL HYSTERECTOMY     ANKLE SURGERY Right 2004   CHOLECYSTECTOMY N/A 02/13/2020   Procedure: LAPAROSCOPIC CHOLECYSTECTOMY;  Surgeon: Lucretia Roers, MD;  Location: [facility];  Service: General;  Laterality: N/A;   TONSILLECTOMY       Allergies  Allergen Reactions   Cucumber Extract Other (See Comments)    Vomiting   Strawberry (Diagnostic) Other (See Comments)    Vomiting       Family History  Problem Relation Age of Onset   Heart attack Father    Anesthesia problems Daughter        Sensitive to medication/ waking up      Social History Ms. Relaford reports that she has never smoked. She has never used smokeless tobacco. Ms. Reimold reports no history of alcohol use.   Review of Systems CONSTITUTIONAL: No weight loss, fever, chills, weakness or fatigue.  HEENT: Eyes: No visual loss, blurred vision, double vision or yellow sclerae.No hearing loss, sneezing, congestion, runny nose or sore throat.  SKIN: No rash or itching.  CARDIOVASCULAR: per hpi  RESPIRATORY: No shortness of breath, cough or sputum.  GASTROINTESTINAL: No anorexia, nausea, vomiting or diarrhea. No abdominal pain or blood.  GENITOURINARY: No burning on urination, no polyuria NEUROLOGICAL: No headache, dizziness, syncope, paralysis, ataxia, numbness or tingling in the extremities. No change in bowel or bladder control.  MUSCULOSKELETAL: No muscle, back pain, joint pain or stiffness.  LYMPHATICS: No enlarged nodes. No history of splenectomy.  PSYCHIATRIC: No history of depression or anxiety.  ENDOCRINOLOGIC: No reports of sweating, cold or heat intolerance. No polyuria or polydipsia.  Marland Kitchen   Physical Examination Today's Vitals   12/20/22 0842  BP: 106/68  Pulse: 72  SpO2: 98%  Weight: 128 lb (58.1 kg)   Height: 5' (1.524 m)   Body mass index is 25 kg/m.  Gen: resting comfortably, no acute distress HEENT: no scleral icterus, pupils equal round and reactive, no palptable cervical adenopathy,  CV: RRR, no m/rg no jvd Resp: Clear to auscultation bilaterally GI: abdomen is soft, non-tender, non-distended, normal bowel sounds, no hepatosplenomegaly MSK: extremities are warm, no edema.  Skin: warm, no rash Neuro:  no focal deficits Psych: appropriate affect   Diagnostic Studies 02/2021 nuclear stress There was no ST segment deviation noted during stress. No T wave inversion was noted during stress. The study is normal. There are no perfusion defects This is a low risk study. The left ventricular ejection fraction is hyperdynamic (>65%).    Assessment and Plan   1.HTN - overall well controlled, continue current meds   2.Hyperlipidemia - discussed possibly increase statin to further lower LDL, she is not in favor at this time - continue current meds   F/u 6 months    Antoine Poche, M.D.

## 2022-12-20 NOTE — Patient Instructions (Addendum)
Medication Instructions:  Your physician recommends that you continue on your current medications as directed. Please refer to the Current Medication list given to you today.  *If you need a refill on your cardiac medications before your next appointment, please call your pharmacy*   Lab Work: None If you have labs (blood work) drawn today and your tests are completely normal, you will receive your results only by: MyChart Message (if you have MyChart) OR A paper copy in the mail If you have any lab test that is abnormal or we need to change your treatment, we will call you to review the results.   Testing/Procedures: None   Follow-Up: At [facility], you and your health needs are our priority.  As part of our continuing mission to provide you with exceptional heart care, we have created designated Provider Care Teams.  These Care Teams include your primary Cardiologist (physician) and Advanced Practice Providers (APPs -  Physician Assistants and Nurse Practitioners) who all work together to provide you with the care you need, when you need it.  We recommend signing up for the patient portal called "MyChart".  Sign up information is provided on this After Visit Summary.  MyChart is used to connect with patients for Virtual Visits (Telemedicine).  Patients are able to view lab/test results, encounter notes, upcoming appointments, etc.  Non-urgent messages can be sent to your provider as well.   To learn more about what you can do with MyChart, go to [email or web address].    Your next appointment:   1 year  Provider:   Dina Rich, MD    Other Instructions

## 2022-12-23 ENCOUNTER — Other Ambulatory Visit: Payer: Self-pay | Admitting: Cardiology

## 2023-01-15 DIAGNOSIS — Z961 Presence of intraocular lens: Secondary | ICD-10-CM | POA: Diagnosis not present

## 2023-01-15 DIAGNOSIS — H02831 Dermatochalasis of right upper eyelid: Secondary | ICD-10-CM | POA: Diagnosis not present

## 2023-01-15 DIAGNOSIS — H26493 Other secondary cataract, bilateral: Secondary | ICD-10-CM | POA: Diagnosis not present

## 2023-01-15 DIAGNOSIS — H02834 Dermatochalasis of left upper eyelid: Secondary | ICD-10-CM | POA: Diagnosis not present

## 2023-03-19 DIAGNOSIS — I10 Essential (primary) hypertension: Secondary | ICD-10-CM | POA: Diagnosis not present

## 2023-03-19 DIAGNOSIS — N182 Chronic kidney disease, stage 2 (mild): Secondary | ICD-10-CM | POA: Diagnosis not present

## 2023-03-19 DIAGNOSIS — I700 Atherosclerosis of aorta: Secondary | ICD-10-CM | POA: Diagnosis not present

## 2023-03-19 DIAGNOSIS — E7849 Other hyperlipidemia: Secondary | ICD-10-CM | POA: Diagnosis not present

## 2023-03-19 DIAGNOSIS — M7911 Myalgia of mastication muscle: Secondary | ICD-10-CM | POA: Diagnosis not present

## 2023-03-19 DIAGNOSIS — M5430 Sciatica, unspecified side: Secondary | ICD-10-CM | POA: Diagnosis not present

## 2023-03-19 DIAGNOSIS — E038 Other specified hypothyroidism: Secondary | ICD-10-CM | POA: Diagnosis not present

## 2023-03-19 DIAGNOSIS — M810 Age-related osteoporosis without current pathological fracture: Secondary | ICD-10-CM | POA: Diagnosis not present

## 2023-03-19 DIAGNOSIS — L570 Actinic keratosis: Secondary | ICD-10-CM | POA: Diagnosis not present

## 2023-03-19 DIAGNOSIS — M545 Low back pain, unspecified: Secondary | ICD-10-CM | POA: Diagnosis not present

## 2023-03-19 DIAGNOSIS — F411 Generalized anxiety disorder: Secondary | ICD-10-CM | POA: Diagnosis not present

## 2023-03-19 DIAGNOSIS — M353 Polymyalgia rheumatica: Secondary | ICD-10-CM | POA: Diagnosis not present

## 2023-03-27 ENCOUNTER — Ambulatory Visit: Payer: Medicare Other | Admitting: "Endocrinology

## 2023-05-17 DIAGNOSIS — M5431 Sciatica, right side: Secondary | ICD-10-CM | POA: Diagnosis not present

## 2023-05-17 DIAGNOSIS — I10 Essential (primary) hypertension: Secondary | ICD-10-CM | POA: Diagnosis not present

## 2023-06-14 DIAGNOSIS — M5431 Sciatica, right side: Secondary | ICD-10-CM | POA: Diagnosis not present

## 2023-06-14 DIAGNOSIS — M545 Low back pain, unspecified: Secondary | ICD-10-CM | POA: Diagnosis not present

## 2023-06-14 DIAGNOSIS — M7911 Myalgia of mastication muscle: Secondary | ICD-10-CM | POA: Diagnosis not present

## 2023-06-14 DIAGNOSIS — E038 Other specified hypothyroidism: Secondary | ICD-10-CM | POA: Diagnosis not present

## 2023-06-14 DIAGNOSIS — M810 Age-related osteoporosis without current pathological fracture: Secondary | ICD-10-CM | POA: Diagnosis not present

## 2023-06-14 DIAGNOSIS — I10 Essential (primary) hypertension: Secondary | ICD-10-CM | POA: Diagnosis not present

## 2023-06-14 DIAGNOSIS — M5430 Sciatica, unspecified side: Secondary | ICD-10-CM | POA: Diagnosis not present

## 2023-06-14 DIAGNOSIS — I700 Atherosclerosis of aorta: Secondary | ICD-10-CM | POA: Diagnosis not present

## 2023-06-14 DIAGNOSIS — F411 Generalized anxiety disorder: Secondary | ICD-10-CM | POA: Diagnosis not present

## 2023-06-14 DIAGNOSIS — M353 Polymyalgia rheumatica: Secondary | ICD-10-CM | POA: Diagnosis not present

## 2023-06-14 DIAGNOSIS — N182 Chronic kidney disease, stage 2 (mild): Secondary | ICD-10-CM | POA: Diagnosis not present

## 2023-06-14 DIAGNOSIS — E7849 Other hyperlipidemia: Secondary | ICD-10-CM | POA: Diagnosis not present

## 2023-06-25 ENCOUNTER — Ambulatory Visit: Payer: Medicare Other | Admitting: Cardiology

## 2023-07-03 DIAGNOSIS — M810 Age-related osteoporosis without current pathological fracture: Secondary | ICD-10-CM | POA: Diagnosis not present

## 2023-07-28 ENCOUNTER — Other Ambulatory Visit: Payer: Self-pay | Admitting: Cardiology

## 2023-08-01 DIAGNOSIS — R978 Other abnormal tumor markers: Secondary | ICD-10-CM | POA: Diagnosis not present

## 2023-08-01 DIAGNOSIS — L570 Actinic keratosis: Secondary | ICD-10-CM | POA: Diagnosis not present

## 2023-08-01 DIAGNOSIS — I10 Essential (primary) hypertension: Secondary | ICD-10-CM | POA: Diagnosis not present

## 2023-08-01 DIAGNOSIS — R109 Unspecified abdominal pain: Secondary | ICD-10-CM | POA: Diagnosis not present

## 2023-08-09 DIAGNOSIS — R1084 Generalized abdominal pain: Secondary | ICD-10-CM | POA: Diagnosis not present

## 2023-08-09 DIAGNOSIS — Z9049 Acquired absence of other specified parts of digestive tract: Secondary | ICD-10-CM | POA: Diagnosis not present

## 2023-08-09 DIAGNOSIS — R109 Unspecified abdominal pain: Secondary | ICD-10-CM | POA: Diagnosis not present

## 2023-08-09 DIAGNOSIS — N2889 Other specified disorders of kidney and ureter: Secondary | ICD-10-CM | POA: Diagnosis not present

## 2023-09-17 DIAGNOSIS — E7849 Other hyperlipidemia: Secondary | ICD-10-CM | POA: Diagnosis not present

## 2023-09-17 DIAGNOSIS — I10 Essential (primary) hypertension: Secondary | ICD-10-CM | POA: Diagnosis not present

## 2023-09-17 DIAGNOSIS — M810 Age-related osteoporosis without current pathological fracture: Secondary | ICD-10-CM | POA: Diagnosis not present

## 2023-09-17 DIAGNOSIS — R109 Unspecified abdominal pain: Secondary | ICD-10-CM | POA: Diagnosis not present

## 2023-09-17 DIAGNOSIS — F411 Generalized anxiety disorder: Secondary | ICD-10-CM | POA: Diagnosis not present

## 2023-09-17 DIAGNOSIS — M5431 Sciatica, right side: Secondary | ICD-10-CM | POA: Diagnosis not present

## 2023-09-17 DIAGNOSIS — E038 Other specified hypothyroidism: Secondary | ICD-10-CM | POA: Diagnosis not present

## 2023-09-17 DIAGNOSIS — I700 Atherosclerosis of aorta: Secondary | ICD-10-CM | POA: Diagnosis not present

## 2023-09-17 DIAGNOSIS — L570 Actinic keratosis: Secondary | ICD-10-CM | POA: Diagnosis not present

## 2023-09-17 DIAGNOSIS — M7911 Myalgia of mastication muscle: Secondary | ICD-10-CM | POA: Diagnosis not present

## 2023-09-17 DIAGNOSIS — N182 Chronic kidney disease, stage 2 (mild): Secondary | ICD-10-CM | POA: Diagnosis not present

## 2023-10-05 DIAGNOSIS — Z23 Encounter for immunization: Secondary | ICD-10-CM | POA: Diagnosis not present

## 2023-11-08 DIAGNOSIS — M25461 Effusion, right knee: Secondary | ICD-10-CM | POA: Diagnosis not present

## 2023-11-08 DIAGNOSIS — I10 Essential (primary) hypertension: Secondary | ICD-10-CM | POA: Diagnosis not present

## 2023-12-17 DIAGNOSIS — M5431 Sciatica, right side: Secondary | ICD-10-CM | POA: Diagnosis not present

## 2023-12-17 DIAGNOSIS — M810 Age-related osteoporosis without current pathological fracture: Secondary | ICD-10-CM | POA: Diagnosis not present

## 2023-12-17 DIAGNOSIS — I700 Atherosclerosis of aorta: Secondary | ICD-10-CM | POA: Diagnosis not present

## 2023-12-17 DIAGNOSIS — I10 Essential (primary) hypertension: Secondary | ICD-10-CM | POA: Diagnosis not present

## 2023-12-17 DIAGNOSIS — F411 Generalized anxiety disorder: Secondary | ICD-10-CM | POA: Diagnosis not present

## 2023-12-17 DIAGNOSIS — Z0000 Encounter for general adult medical examination without abnormal findings: Secondary | ICD-10-CM | POA: Diagnosis not present

## 2023-12-17 DIAGNOSIS — M7911 Myalgia of mastication muscle: Secondary | ICD-10-CM | POA: Diagnosis not present

## 2023-12-17 DIAGNOSIS — N182 Chronic kidney disease, stage 2 (mild): Secondary | ICD-10-CM | POA: Diagnosis not present

## 2023-12-17 DIAGNOSIS — Z1331 Encounter for screening for depression: Secondary | ICD-10-CM | POA: Diagnosis not present

## 2023-12-17 DIAGNOSIS — E039 Hypothyroidism, unspecified: Secondary | ICD-10-CM | POA: Diagnosis not present

## 2024-01-01 ENCOUNTER — Ambulatory Visit: Payer: Medicare Other | Admitting: Cardiology

## 2024-01-02 DIAGNOSIS — R1084 Generalized abdominal pain: Secondary | ICD-10-CM | POA: Diagnosis not present

## 2024-01-02 DIAGNOSIS — N281 Cyst of kidney, acquired: Secondary | ICD-10-CM | POA: Diagnosis not present

## 2024-01-02 DIAGNOSIS — Z9049 Acquired absence of other specified parts of digestive tract: Secondary | ICD-10-CM | POA: Diagnosis not present

## 2024-01-03 ENCOUNTER — Ambulatory Visit: Payer: Medicare Other | Attending: Cardiology | Admitting: Cardiology

## 2024-01-03 ENCOUNTER — Encounter: Payer: Self-pay | Admitting: Cardiology

## 2024-01-03 VITALS — BP 130/76 | HR 77 | Ht 60.0 in | Wt 119.0 lb

## 2024-01-03 DIAGNOSIS — E782 Mixed hyperlipidemia: Secondary | ICD-10-CM

## 2024-01-03 DIAGNOSIS — R0789 Other chest pain: Secondary | ICD-10-CM

## 2024-01-03 DIAGNOSIS — I10 Essential (primary) hypertension: Secondary | ICD-10-CM | POA: Diagnosis not present

## 2024-01-03 NOTE — Progress Notes (Signed)
 Clinical Summary Olivia Haas is a 89 y.o.female seen today for follow up of the following medical problems.        1. HTN - Home bp's 120s-130s/60s-70s - Norvasc  5mg  in AM and 2.5mg     2. Joint pains - managed by pcp   3.Hyperlipidemia - compliant with statin   - takes atorvastatin every other day. Reports some muscle aches if takes daily - 09/2023 TC 183 TG 96 HDL 56 LDL 110       4.Chest pain - left arm shooting pain. Can have some SOB at times but not specifically associated with arm pain.  - pain can last a few hours. Some improvement with position. Occurs 3-4 times a week - prior to recent fall was very physically, now more sedentary - can some DOE with short distances  CAD risk factors: HTN, hyperlipidemia, father MI 43  02/2021 nuclear stress: no ischemia -chronic MSK pains, no specific chest pains.    - walks daily 15 minute without exertional symptoms.  - EKG today NSR, no ischemic changes     Her daughter is Olivia Haas one of the stress lab nurses at [employer] Past Medical History:  Diagnosis Date   High cholesterol    Hypertension    Osteoporosis    Thyroid  disease      Allergies  Allergen Reactions   Cucumber Extract Other (See Comments)    Vomiting   Strawberry (Diagnostic) Other (See Comments)    Vomiting      Current Outpatient Medications  Medication Sig Dispense Refill   acetaminophen  (TYLENOL ) 325 MG tablet Take 2 tablets (650 mg total) by mouth every 6 (six) hours as needed for mild pain (or temp > 100). 12 tablet 0   amLODipine  (NORVASC ) 2.5 MG tablet TAKE 2 TABLETS (5 MG TOTAL) BY MOUTH EVERY MORNING AND 1 TABLET (2.5 MG TOTAL) EVERY EVENING. 270 tablet 3   atorvastatin (LIPITOR) 20 MG tablet Take 20 mg by mouth every other day.     cholecalciferol (VITAMIN D3) 25 MCG (1000 UNIT) tablet Take 3,000 Units by mouth daily.     cyanocobalamin  (,VITAMIN B-12,) 1000 MCG/ML injection cyanocobalamin  (vit B-12) 1,000 mcg/mL injection  solution  INJECT 1 MILLILITER(1000MCG)BY INTRAMUSCULAR ROUTE ONCE A MONTH     levothyroxine  (SYNTHROID ) 88 MCG tablet Take 88 mcg by mouth every morning.     loperamide  (IMODIUM ) 2 MG capsule Take 1 capsule (2 mg total) by mouth daily as needed for diarrhea or loose stools.     No current facility-administered medications for this visit.     Past Surgical History:  Procedure Laterality Date   ABDOMINAL HYSTERECTOMY     ANKLE SURGERY Right 2004   CHOLECYSTECTOMY N/A 02/13/2020   Procedure: LAPAROSCOPIC CHOLECYSTECTOMY;  Surgeon: Awilda Bogus, MD;  Location: [facility];  Service: General;  Laterality: N/A;   TONSILLECTOMY       Allergies  Allergen Reactions   Cucumber Extract Other (See Comments)    Vomiting   Strawberry (Diagnostic) Other (See Comments)    Vomiting       Family History  Problem Relation Age of Onset   Heart attack Father    Anesthesia problems Daughter        Sensitive to medication/ waking up      Social History Olivia Haas reports that she has never smoked. She has never used smokeless tobacco. Olivia Haas reports no history of alcohol use.   Physical Examination Today's Vitals   01/03/24  1007  BP: 130/76  Pulse: 77  SpO2: 99%  Weight: 119 lb (54 kg)  Height: 5' (1.524 m)  PainSc: 0-No pain   Body mass index is 23.24 kg/m.  Gen: resting comfortably, no acute distress HEENT: no scleral icterus, pupils equal round and reactive, no palptable cervical adenopathy,  CV: RRR, no m/r,g  no jvd Resp: Clear to auscultation bilaterally GI: abdomen is soft, non-tender, non-distended, normal bowel sounds, no hepatosplenomegaly MSK: extremities are warm, no edema.  Skin: warm, no rash Neuro:  no focal deficits Psych: appropriate affect   Diagnostic Studies 02/2021 nuclear stress There was no ST segment deviation noted during stress. No T wave inversion was noted during stress. The study is normal. There are no perfusion defects This is a low  risk study. The left ventricular ejection fraction is hyperdynamic (>65%).        Assessment and Plan  1.HTN - at goal, continue current meds   2.Hyperlipidemia - LDL slightly above goal, much improved from last year.  - only tolerates atorvastatin every other day - reasonable control, she is somewhat hesitant to change statin. I think reasonable to continue current regimen  3. Chest pain - no recent symptoms, prior stress testing was benign. - EKG today SR, no acute ischemic changes - continue to monitor   F/u 1 year.      Laurann Pollock, M.D.

## 2024-01-03 NOTE — Patient Instructions (Signed)

## 2024-02-05 DIAGNOSIS — R39198 Other difficulties with micturition: Secondary | ICD-10-CM | POA: Diagnosis not present

## 2024-02-05 DIAGNOSIS — N2889 Other specified disorders of kidney and ureter: Secondary | ICD-10-CM | POA: Diagnosis not present

## 2024-02-05 DIAGNOSIS — N281 Cyst of kidney, acquired: Secondary | ICD-10-CM | POA: Diagnosis not present

## 2024-03-10 DIAGNOSIS — D692 Other nonthrombocytopenic purpura: Secondary | ICD-10-CM | POA: Diagnosis not present

## 2024-03-10 DIAGNOSIS — R197 Diarrhea, unspecified: Secondary | ICD-10-CM | POA: Diagnosis not present

## 2024-03-10 DIAGNOSIS — N182 Chronic kidney disease, stage 2 (mild): Secondary | ICD-10-CM | POA: Diagnosis not present

## 2024-03-10 DIAGNOSIS — I10 Essential (primary) hypertension: Secondary | ICD-10-CM | POA: Diagnosis not present

## 2024-03-10 DIAGNOSIS — M5431 Sciatica, right side: Secondary | ICD-10-CM | POA: Diagnosis not present

## 2024-03-10 DIAGNOSIS — F411 Generalized anxiety disorder: Secondary | ICD-10-CM | POA: Diagnosis not present

## 2024-03-10 DIAGNOSIS — E039 Hypothyroidism, unspecified: Secondary | ICD-10-CM | POA: Diagnosis not present

## 2024-03-10 DIAGNOSIS — M810 Age-related osteoporosis without current pathological fracture: Secondary | ICD-10-CM | POA: Diagnosis not present

## 2024-03-10 DIAGNOSIS — I700 Atherosclerosis of aorta: Secondary | ICD-10-CM | POA: Diagnosis not present

## 2024-03-10 DIAGNOSIS — M7911 Myalgia of mastication muscle: Secondary | ICD-10-CM | POA: Diagnosis not present

## 2024-04-28 DIAGNOSIS — H35372 Puckering of macula, left eye: Secondary | ICD-10-CM | POA: Diagnosis not present

## 2024-04-28 DIAGNOSIS — H6123 Impacted cerumen, bilateral: Secondary | ICD-10-CM | POA: Diagnosis not present

## 2024-04-28 DIAGNOSIS — H26491 Other secondary cataract, right eye: Secondary | ICD-10-CM | POA: Diagnosis not present

## 2024-04-28 DIAGNOSIS — H20041 Secondary noninfectious iridocyclitis, right eye: Secondary | ICD-10-CM | POA: Diagnosis not present

## 2024-04-28 DIAGNOSIS — H43811 Vitreous degeneration, right eye: Secondary | ICD-10-CM | POA: Diagnosis not present

## 2024-06-02 DIAGNOSIS — N182 Chronic kidney disease, stage 2 (mild): Secondary | ICD-10-CM | POA: Diagnosis not present

## 2024-06-02 DIAGNOSIS — F411 Generalized anxiety disorder: Secondary | ICD-10-CM | POA: Diagnosis not present

## 2024-06-02 DIAGNOSIS — I10 Essential (primary) hypertension: Secondary | ICD-10-CM | POA: Diagnosis not present

## 2024-06-02 DIAGNOSIS — I700 Atherosclerosis of aorta: Secondary | ICD-10-CM | POA: Diagnosis not present

## 2024-06-02 DIAGNOSIS — M810 Age-related osteoporosis without current pathological fracture: Secondary | ICD-10-CM | POA: Diagnosis not present

## 2024-06-02 DIAGNOSIS — D692 Other nonthrombocytopenic purpura: Secondary | ICD-10-CM | POA: Diagnosis not present

## 2024-06-02 DIAGNOSIS — M5431 Sciatica, right side: Secondary | ICD-10-CM | POA: Diagnosis not present

## 2024-06-02 DIAGNOSIS — M7911 Myalgia of mastication muscle: Secondary | ICD-10-CM | POA: Diagnosis not present

## 2024-06-02 DIAGNOSIS — E039 Hypothyroidism, unspecified: Secondary | ICD-10-CM | POA: Diagnosis not present

## 2024-06-03 ENCOUNTER — Other Ambulatory Visit (HOSPITAL_BASED_OUTPATIENT_CLINIC_OR_DEPARTMENT_OTHER): Payer: Self-pay

## 2024-06-11 DIAGNOSIS — Z23 Encounter for immunization: Secondary | ICD-10-CM | POA: Diagnosis not present

## 2024-07-07 DIAGNOSIS — H43811 Vitreous degeneration, right eye: Secondary | ICD-10-CM | POA: Diagnosis not present

## 2024-07-07 DIAGNOSIS — H20041 Secondary noninfectious iridocyclitis, right eye: Secondary | ICD-10-CM | POA: Diagnosis not present

## 2024-07-07 DIAGNOSIS — H35372 Puckering of macula, left eye: Secondary | ICD-10-CM | POA: Diagnosis not present

## 2024-07-24 DIAGNOSIS — R079 Chest pain, unspecified: Secondary | ICD-10-CM | POA: Diagnosis not present

## 2024-07-24 DIAGNOSIS — L570 Actinic keratosis: Secondary | ICD-10-CM | POA: Diagnosis not present

## 2024-08-20 ENCOUNTER — Other Ambulatory Visit (HOSPITAL_COMMUNITY): Payer: Self-pay

## 2024-08-20 DIAGNOSIS — M5459 Other low back pain: Secondary | ICD-10-CM

## 2024-08-26 ENCOUNTER — Ambulatory Visit (HOSPITAL_COMMUNITY)

## 2024-08-29 ENCOUNTER — Other Ambulatory Visit: Payer: Self-pay | Admitting: Cardiology

## 2024-09-02 ENCOUNTER — Ambulatory Visit (HOSPITAL_COMMUNITY)
Admission: RE | Admit: 2024-09-02 | Discharge: 2024-09-02 | Disposition: A | Discharge status: Home / Self Care | Source: Ambulatory Visit

## 2024-09-02 DIAGNOSIS — M5459 Other low back pain: Secondary | ICD-10-CM | POA: Diagnosis present

## 2024-09-19 ENCOUNTER — Encounter: Payer: Self-pay | Admitting: Emergency Medicine

## 2024-09-19 ENCOUNTER — Ambulatory Visit

## 2024-09-19 ENCOUNTER — Ambulatory Visit
Admission: EM | Admit: 2024-09-19 | Discharge: 2024-09-19 | Disposition: A | Discharge status: Home / Self Care | Source: Home / Self Care

## 2024-09-19 DIAGNOSIS — R109 Unspecified abdominal pain: Secondary | ICD-10-CM

## 2024-09-19 LAB — POCT URINE DIPSTICK
Bilirubin, UA: NEGATIVE
Blood, UA: NEGATIVE
Glucose, UA: NEGATIVE mg/dL
Ketones, POC UA: NEGATIVE mg/dL
Nitrite, UA: NEGATIVE
POC PROTEIN,UA: NEGATIVE
Spec Grav, UA: 1.015
Urobilinogen, UA: 0.2 U/dL
pH, UA: 7

## 2024-09-19 MED ORDER — FAMOTIDINE 20 MG PO TABS: 20.0000 mg | ORAL_TABLET | Freq: Every day | ORAL | 0 refills | Status: AC

## 2024-09-19 MED ORDER — LIDOCAINE VISCOUS HCL 2 % MT SOLN
15.0000 mL | Freq: Once | OROMUCOSAL | Status: AC
  Administered 2024-09-19: 15 mL via OROMUCOSAL

## 2024-09-19 MED ORDER — MYLANTA MAXIMUM STRENGTH 400-400-40 MG/5ML PO SUSP: 10.0000 mL | Freq: Four times a day (QID) | ORAL | 0 refills | Status: AC | PRN

## 2024-09-19 MED ORDER — ALUM & MAG HYDROXIDE-SIMETH 200-200-20 MG/5ML PO SUSP
30.0000 mL | Freq: Once | ORAL | Status: AC
  Administered 2024-09-19: 30 mL via ORAL

## 2024-09-19 NOTE — ED Triage Notes (Signed)
 Abd pain around naval x 2 days.  Last BM was last night and was normal.  Denies urinary symptoms

## 2024-09-19 NOTE — ED Provider Notes (Signed)
 " [facility]    CSN: [id number] Arrival date & time: 09/19/24  1127      History   Chief Complaint No chief complaint on file.   HPI Olivia Haas is a 89 y.o. female.   The history is provided by the patient.   Patient presents for complaints of abdominal pain that is been present for the past day.  She states the pain is located around her bellybutton.  She states pain becomes worse when she is lying down.  She describes the pain as a dull ache.  She denies fever, chills, nausea, vomiting, gas, bloating, diarrhea, bloody stools, melena stools, or urinary symptoms.  States that she has been experiencing burping and belching.  Family reports history of cholecystectomy.  Patient states that she has taken Tylenol  for her symptoms with some relief.  Denies other gastrointestinal history.  Per review of her chart, she does have a history of biliary colic.  Past Medical History:  Diagnosis Date   High cholesterol    Hypertension    Osteoporosis    Thyroid  disease     Patient Active Problem List   Diagnosis Date Noted   Essential hypertension, benign 09/25/2022   Mixed hyperlipidemia 09/07/2022   Hypothyroidism 09/06/2022   Pain in left shoulder 01/25/2022   Pain in right knee 12/28/2021   Pain in right shoulder 12/28/2021   Postcholecystectomy diarrhea 04/07/2021   Weight loss 02/24/2021   Alkaline phosphatase elevation 02/24/2021   Shingles rash- Rt Flank 02/16/2020   Elevated BP without diagnosis of hypertension 02/15/2020   Hyponatremia 02/15/2020   Hypokalemia 02/15/2020   Symptomatic cholelithiasis--status post lap chole 02/13/20 02/12/2020   N&V (nausea and vomiting) 02/12/2020   Biliary colic 02/12/2020   History of lumbar laminectomy for spinal cord decompression 12/04/2019    Past Surgical History:  Procedure Laterality Date   ABDOMINAL HYSTERECTOMY     ANKLE SURGERY Right 2004   CHOLECYSTECTOMY N/A 02/13/2020   Procedure: LAPAROSCOPIC  CHOLECYSTECTOMY;  Surgeon: Kallie Manuelita BROCKS, MD;  Location: [facility];  Service: General;  Laterality: N/A;   TONSILLECTOMY      OB History   No obstetric history on file.      Home Medications    Prior to Admission medications  Medication Sig Start Date End Date Taking? Authorizing Provider  alum & mag hydroxide-simeth (MYLANTA MAXIMUM STRENGTH) 400-400-40 MG/5ML suspension Take 10 mLs by mouth every 6 (six) hours as needed for indigestion. 09/19/24  Yes Leath-Warren, Etta PARAS, NP  famotidine  (PEPCID ) 20 MG tablet Take 1 tablet (20 mg total) by mouth daily. 09/19/24  Yes Leath-Warren, Etta PARAS, NP  acetaminophen  (TYLENOL ) 325 MG tablet Take 2 tablets (650 mg total) by mouth every 6 (six) hours as needed for mild pain (or temp > 100). 02/16/20   Emokpae, Courage, MD  amLODipine  (NORVASC ) 2.5 MG tablet TAKE 2 TABLETS (5 MG TOTAL) BY MOUTH EVERY MORNING AND 1 TABLET (2.5 MG TOTAL) EVERY EVENING. 08/29/24   Alvan Dorn FALCON, MD  atorvastatin (LIPITOR) 20 MG tablet Take 20 mg by mouth every other day.    [provider]  cholecalciferol (VITAMIN D3) 25 MCG (1000 UNIT) tablet Take 3,000 Units by mouth daily.    [provider]  cyanocobalamin  (,VITAMIN B-12,) 1000 MCG/ML injection cyanocobalamin  (vit B-12) 1,000 mcg/mL injection solution  INJECT 1 MILLILITER(1000MCG)BY INTRAMUSCULAR ROUTE ONCE A MONTH    [provider]  levothyroxine  (SYNTHROID ) 88 MCG tablet Take 88 mcg by mouth every morning. 11/20/19  [provider]  loperamide  (IMODIUM ) 2 MG capsule Take 1 capsule (2 mg total) by mouth daily as needed for diarrhea or loose stools. 04/07/21   Golda Claudis PENNER, MD    Family History Family History  Problem Relation Age of Onset   Heart attack Father    Anesthesia problems Daughter        Sensitive to medication/ waking up     Social History Social History[1]   Allergies   Cucumber extract and Strawberry (diagnostic)   Review of Systems Review  of Systems Per HPI  Physical Exam Triage Vital Signs ED Triage Vitals  Encounter Vitals Group     BP 09/19/24 1201 139/83     Girls Systolic BP Percentile --      Girls Diastolic BP Percentile --      Boys Systolic BP Percentile --      Boys Diastolic BP Percentile --      Pulse Rate 09/19/24 1201 86     Resp 09/19/24 1201 18     Temp 09/19/24 1201 97.8 F (36.6 C)     Temp Source 09/19/24 1201 Oral     SpO2 09/19/24 1201 95 %     Weight --      Height --      Head Circumference --      Peak Flow --      Pain Score 09/19/24 1202 0     Pain Loc --      Pain Education --      Exclude from Growth Chart --    No data found.  Updated Vital Signs BP 139/83 (BP Location: Right Arm)   Pulse 86   Temp 97.8 F (36.6 C) (Oral)   Resp 18   SpO2 95%   Visual Acuity Right Eye Distance:   Left Eye Distance:   Bilateral Distance:    Right Eye Near:   Left Eye Near:    Bilateral Near:     Physical Exam Vitals and nursing note reviewed.  Constitutional:      General: She is not in acute distress.    Appearance: Normal appearance.  HENT:     Head: Normocephalic.  Eyes:     Extraocular Movements: Extraocular movements intact.     Conjunctiva/sclera: Conjunctivae normal.     Pupils: Pupils are equal, round, and reactive to light.  Cardiovascular:     Rate and Rhythm: Normal rate and regular rhythm.     Pulses: Normal pulses.     Heart sounds: Normal heart sounds.  Pulmonary:     Effort: Pulmonary effort is normal. No respiratory distress.     Breath sounds: Normal breath sounds. No stridor. No wheezing, rhonchi or rales.  Abdominal:     General: Bowel sounds are normal.     Palpations: Abdomen is soft.     Tenderness: There is abdominal tenderness in the periumbilical area and left lower quadrant.  Musculoskeletal:     Cervical back: Normal range of motion.  Skin:    General: Skin is warm and dry.  Neurological:     General: No focal deficit present.     Mental  Status: She is alert and oriented to person, place, and time.  Psychiatric:        Mood and Affect: Mood normal.        Behavior: Behavior normal.      UC Treatments / Results  Labs (all labs ordered are listed, but only abnormal results are displayed) Labs  Reviewed  POCT URINE DIPSTICK - Abnormal; Notable for the following components:      Result Value   Leukocytes, UA Trace (*)    All other components within normal limits    EKG   Radiology DG Abd 1 View Result Date: 09/19/2024 CLINICAL DATA:  Abdominal pain for 1 day. EXAM: ABDOMEN - 1 VIEW COMPARISON:  None Available. FINDINGS: The bowel gas pattern is non-obstructive. No evidence of pneumoperitoneum, within the limitations of a supine film. No acute osseous abnormalities. Mild-to-moderate degenerative changes of the lumbar spine noted. The soft tissues are within normal limits. Surgical changes, devices, tubes and lines: None. IMPRESSION: Nonobstructive bowel gas pattern. Electronically Signed   By: Ree Molt M.D.   On: 09/19/2024 13:47    Procedures Procedures (including critical care time)  Medications Ordered in UC Medications  alum & mag hydroxide-simeth (MAALOX/MYLANTA) 200-200-20 MG/5ML suspension 30 mL (30 mLs Oral Given 09/19/24 1355)  lidocaine  (XYLOCAINE ) 2 % viscous mouth solution 15 mL (15 mLs Mouth/Throat Given 09/19/24 1355)    Initial Impression / Assessment and Plan / UC Course  I have reviewed the triage vital signs and the nursing notes.  Pertinent labs & imaging results that were available during my care of the patient were reviewed by me and considered in my medical decision making (see chart for details).  Abdominal x-ray and urinalysis were negative.  GI cocktail was administered to see if this provided any relief of the patient's symptoms.  Patient reports improvement of symptoms after the GI cocktail.  On exam, she does exhibit left lower quadrant tenderness, she also complains of periumbilical  tenderness.  Given that she has some improvement with a GI cocktail, will send a prescription for Mylanta and famotidine  20 mg.  Supportive care recommendations were provided and discussed with the patient to include a bland or BRAT diet, over-the-counter Tylenol , and to monitor for worsening symptoms.  Patient's daughter is present, daughter was advised to follow-up in the emergency department if the patient experiences worsening abdominal pain, fever, nausea, vomiting, or other concerns.  Patient and daughter were in agreement with this plan of care and verbalized understanding.  All questions were answered.  Patient stable for discharge.  Final Clinical Impressions(s) / UC Diagnoses   Final diagnoses:  Abdominal pain, unspecified abdominal location     Discharge Instructions      The x-ray of your abdomen and urinalysis were negative.  A urine culture has been ordered.  You will be contacted if the pending test results are abnormal. Take medication as prescribed. You may take over-the-counter Tylenol  as needed for pain, fever, or general discomfort. Recommend a bland diet until your abdominal pain improves.  This includes soups, broth, yogurt, pudding, or Jell-O.  Avoid spicy foods, tomato-based foods, dairy, or caffeine. Go to the emergency department if you experience worsening abdominal pain, or develop new symptoms of fever, chills, nausea, or vomiting. Follow-up with your primary care physician if symptoms fail to improve within the next 7 to 10 days. Follow-up as needed.     ED Prescriptions     Medication Sig Dispense Auth. Provider   alum & mag hydroxide-simeth (MYLANTA MAXIMUM STRENGTH) 400-400-40 MG/5ML suspension Take 10 mLs by mouth every 6 (six) hours as needed for indigestion. 355 mL Leath-Warren, Etta PARAS, NP   famotidine  (PEPCID ) 20 MG tablet Take 1 tablet (20 mg total) by mouth daily. 30 tablet Leath-Warren, Etta PARAS, NP      PDMP not reviewed this  encounter.     [  1]  Social History Tobacco Use   Smoking status: Never   Smokeless tobacco: Never  Vaping Use   Vaping status: Never Used  Substance Use Topics   Alcohol use: No   Drug use: No     Gilmer Etta PARAS, NP 09/19/24 1424  "

## 2024-09-19 NOTE — Discharge Instructions (Signed)
 The x-ray of your abdomen and urinalysis were negative.  A urine culture has been ordered.  You will be contacted if the pending test results are abnormal. Take medication as prescribed. You may take over-the-counter Tylenol  as needed for pain, fever, or general discomfort. Recommend a bland diet until your abdominal pain improves.  This includes soups, broth, yogurt, pudding, or Jell-O.  Avoid spicy foods, tomato-based foods, dairy, or caffeine. Go to the emergency department if you experience worsening abdominal pain, or develop new symptoms of fever, chills, nausea, or vomiting. Follow-up with your primary care physician if symptoms fail to improve within the next 7 to 10 days. Follow-up as needed.
